# Patient Record
Sex: Female | Born: 1975 | Race: White | Hispanic: No | Marital: Married | Smoking: Former smoker
Health system: Southern US, Community
[De-identification: ages and names within clinical notes are randomized; demographics above are authoritative.]

## PROBLEM LIST (undated history)

## (undated) DIAGNOSIS — E669 Obesity, unspecified: Secondary | ICD-10-CM

## (undated) DIAGNOSIS — M545 Low back pain, unspecified: Secondary | ICD-10-CM

## (undated) DIAGNOSIS — G4700 Insomnia, unspecified: Secondary | ICD-10-CM

## (undated) DIAGNOSIS — F419 Anxiety disorder, unspecified: Secondary | ICD-10-CM

## (undated) DIAGNOSIS — C539 Malignant neoplasm of cervix uteri, unspecified: Secondary | ICD-10-CM

## (undated) DIAGNOSIS — D72829 Elevated white blood cell count, unspecified: Secondary | ICD-10-CM

## (undated) DIAGNOSIS — F17200 Nicotine dependence, unspecified, uncomplicated: Secondary | ICD-10-CM

## (undated) DIAGNOSIS — G8929 Other chronic pain: Secondary | ICD-10-CM

## (undated) HISTORY — PX: BREAST SURGERY: SHX581

## (undated) HISTORY — DX: Obesity, unspecified: E66.9

## (undated) HISTORY — DX: Malignant neoplasm of cervix uteri, unspecified: C53.9

## (undated) HISTORY — PX: ABDOMINAL HYSTERECTOMY: SHX81

## (undated) HISTORY — DX: Other chronic pain: G89.29

## (undated) HISTORY — DX: Low back pain, unspecified: M54.50

## (undated) HISTORY — DX: Anxiety disorder, unspecified: F41.9

## (undated) HISTORY — DX: Nicotine dependence, unspecified, uncomplicated: F17.200

## (undated) HISTORY — PX: CERVICAL SPINE SURGERY: SHX589

## (undated) HISTORY — DX: Low back pain: M54.5

## (undated) HISTORY — PX: TOTAL HIP ARTHROPLASTY: SHX124

## (undated) HISTORY — PX: BREAST BIOPSY: SHX20

## (undated) HISTORY — DX: Insomnia, unspecified: G47.00

## (undated) HISTORY — PX: BACK SURGERY: SHX140

## (undated) HISTORY — DX: Elevated white blood cell count, unspecified: D72.829

---

## 2004-09-06 ENCOUNTER — Encounter (INDEPENDENT_AMBULATORY_CARE_PROVIDER_SITE_OTHER): Payer: Self-pay | Admitting: Specialist

## 2004-09-06 ENCOUNTER — Observation Stay (HOSPITAL_COMMUNITY): Admission: RE | Admit: 2004-09-06 | Discharge: 2004-09-07 | Payer: Self-pay | Admitting: Gynecology

## 2006-01-28 ENCOUNTER — Ambulatory Visit: Payer: Self-pay | Admitting: Chiropractor

## 2006-09-21 ENCOUNTER — Ambulatory Visit: Payer: Self-pay | Admitting: Gynecology

## 2007-11-11 ENCOUNTER — Ambulatory Visit: Payer: Self-pay | Admitting: Gynecology

## 2008-07-19 ENCOUNTER — Ambulatory Visit: Payer: Self-pay | Admitting: Gynecology

## 2008-08-01 ENCOUNTER — Ambulatory Visit (HOSPITAL_COMMUNITY): Admission: RE | Admit: 2008-08-01 | Discharge: 2008-08-01 | Payer: Self-pay | Admitting: Gynecology

## 2008-08-09 ENCOUNTER — Ambulatory Visit (HOSPITAL_COMMUNITY): Admission: RE | Admit: 2008-08-09 | Discharge: 2008-08-09 | Payer: Self-pay | Admitting: Gynecology

## 2008-10-12 ENCOUNTER — Ambulatory Visit (HOSPITAL_COMMUNITY): Admission: RE | Admit: 2008-10-12 | Discharge: 2008-10-12 | Payer: Self-pay | Admitting: Obstetrics & Gynecology

## 2009-06-29 ENCOUNTER — Ambulatory Visit: Payer: Self-pay | Admitting: Oncology

## 2009-07-24 ENCOUNTER — Other Ambulatory Visit: Admission: RE | Admit: 2009-07-24 | Discharge: 2009-07-24 | Payer: Self-pay | Admitting: Oncology

## 2009-07-24 ENCOUNTER — Encounter: Payer: Self-pay | Admitting: Oncology

## 2009-07-24 LAB — CMP (CANCER CENTER ONLY)
ALT(SGPT): 26 U/L (ref 10–47)
AST: 23 U/L (ref 11–38)
Albumin: 3.3 g/dL (ref 3.3–5.5)
Alkaline Phosphatase: 87 U/L — ABNORMAL HIGH (ref 26–84)
BUN, Bld: 11 mg/dL (ref 7–22)
CO2: 29 mEq/L (ref 18–33)
Calcium: 9.2 mg/dL (ref 8.0–10.3)
Chloride: 101 mEq/L (ref 98–108)
Creat: 0.6 mg/dl (ref 0.6–1.2)
Glucose, Bld: 92 mg/dL (ref 73–118)
Potassium: 3.7 mEq/L (ref 3.3–4.7)
Sodium: 138 mEq/L (ref 128–145)
Total Bilirubin: 0.4 mg/dl (ref 0.20–1.60)
Total Protein: 7.9 g/dL (ref 6.4–8.1)

## 2009-07-24 LAB — MORPHOLOGY - CHCC SATELLITE
PLT EST ~~LOC~~: ADEQUATE
RBC Comments: NORMAL

## 2009-07-24 LAB — CBC WITH DIFFERENTIAL (CANCER CENTER ONLY)
EOS%: 2.8 % (ref 0.0–7.0)
HCT: 40.8 % (ref 34.8–46.6)
LYMPH#: 3.9 10*3/uL — ABNORMAL HIGH (ref 0.9–3.3)
LYMPH%: 40 % (ref 14.0–48.0)
MCHC: 34.6 g/dL (ref 32.0–36.0)
MONO%: 7.1 % (ref 0.0–13.0)
NEUT%: 48.9 % (ref 39.6–80.0)
Platelets: 351 10*3/uL (ref 145–400)
RDW: 11.3 % (ref 10.5–14.6)

## 2009-07-24 LAB — LACTATE DEHYDROGENASE: LDH: 150 U/L (ref 94–250)

## 2009-08-21 ENCOUNTER — Ambulatory Visit: Payer: Self-pay | Admitting: Oncology

## 2009-10-11 ENCOUNTER — Ambulatory Visit: Payer: Self-pay | Admitting: Oncology

## 2009-10-15 LAB — CBC WITH DIFFERENTIAL (CANCER CENTER ONLY)
Eosinophils Absolute: 0.4 10*3/uL (ref 0.0–0.5)
HGB: 14.3 g/dL (ref 11.6–15.9)
LYMPH%: 33.1 % (ref 14.0–48.0)
MCH: 31.3 pg (ref 26.0–34.0)
MCHC: 33.2 g/dL (ref 32.0–36.0)
MONO#: 0.7 10*3/uL (ref 0.1–0.9)
MONO%: 6.7 % (ref 0.0–13.0)
NEUT%: 55.7 % (ref 39.6–80.0)
Platelets: 328 10*3/uL (ref 145–400)
RDW: 10.8 % (ref 10.5–14.6)
WBC: 10.4 10*3/uL — ABNORMAL HIGH (ref 3.9–10.0)

## 2010-01-17 IMAGING — CT CT PELVIS W/ CM
2 of 5 series · 16 of 46 positions shown, 18 images · IV contrast (APPLIED)
Comparison: Right hip radiographs 08/01/2008.

CT ABDOMEN

CLINICAL DATA: Right lower quadrant abdominal pain with right
groin and hip pain for 2 years.  History back surgery.

CT ABDOMEN AND PELVIS WITH CONTRAST
TECHNIQUE: Multidetector CT imaging of the abdomen and pelvis was
performed using the standard protocol following bolus
administration of intravenous contrast.
Contrast: 100 ml Ymnipaque-JNN intravenously.  Oral contrast was
given.

[Series 2: abd/pelv with 5.0 b31f st · axial · 0.85mm/px · z∈[-514,-89]mm · 13 of 97 slices shown, 15 images]
[im 6/97  soft-tissue]
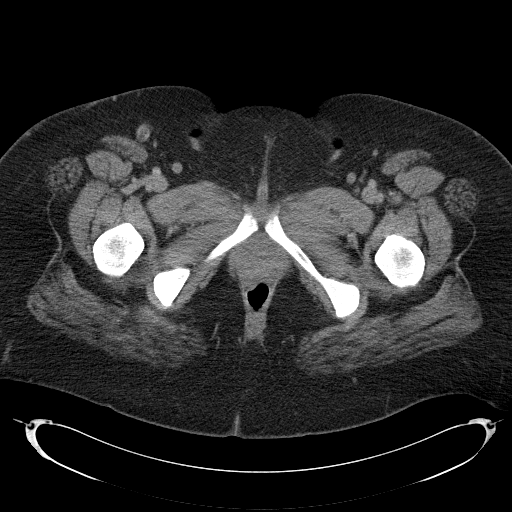
[im 6/97  bone]
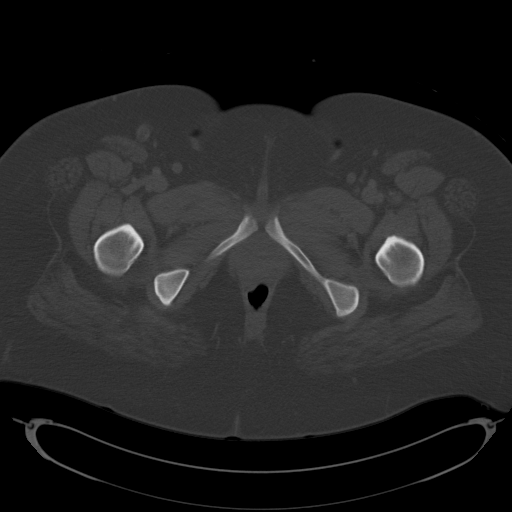
[im 16/97  soft-tissue]
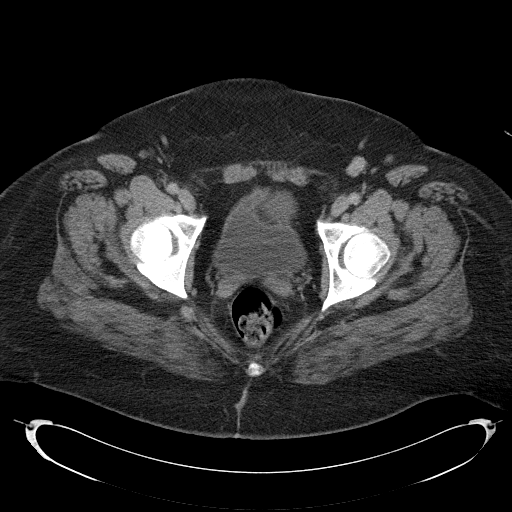
[im 21/97  soft-tissue]
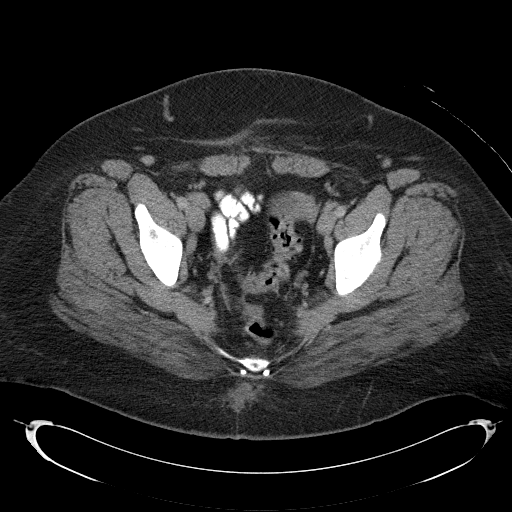
[im 26/97  soft-tissue]
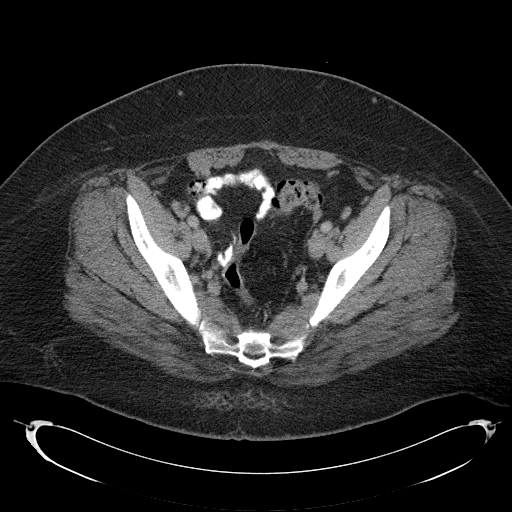
[im 36/97  soft-tissue]
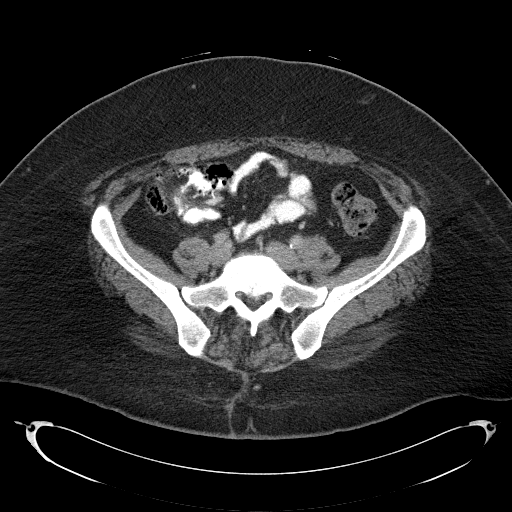
[im 41/97  soft-tissue]
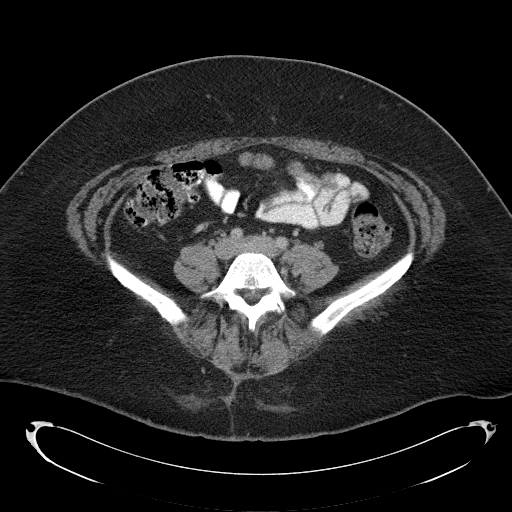
[im 51/97  soft-tissue]
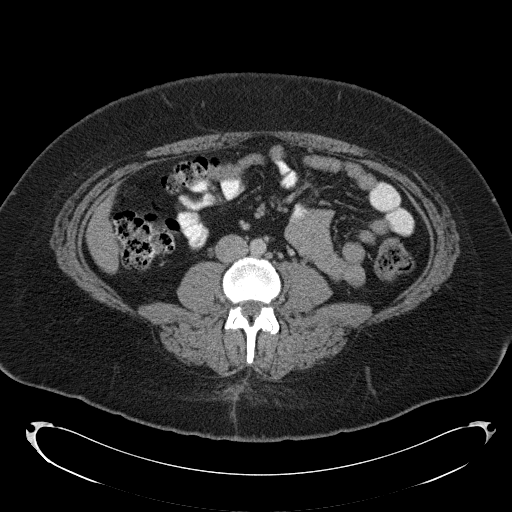
[im 56/97  soft-tissue]
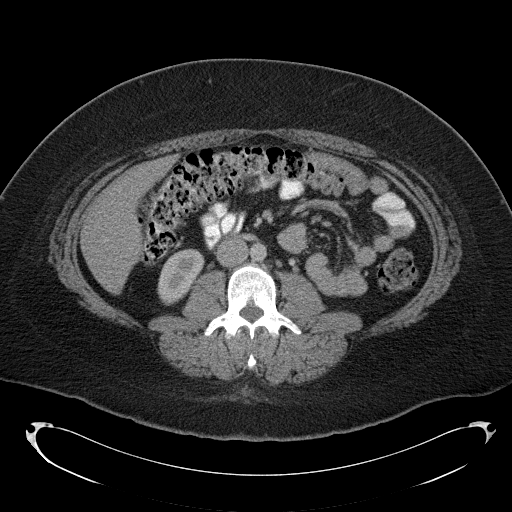
[im 61/97  soft-tissue]
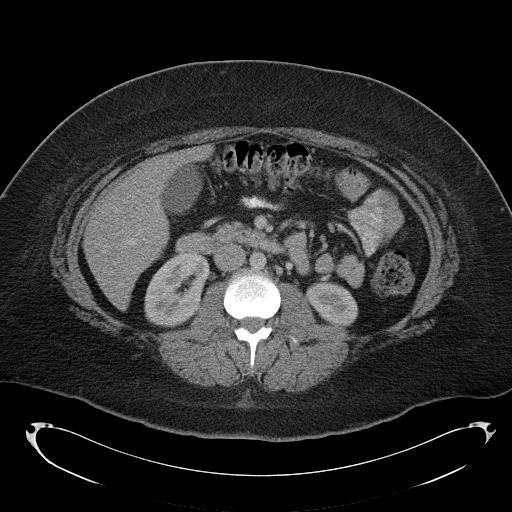
[im 61/97  bone]
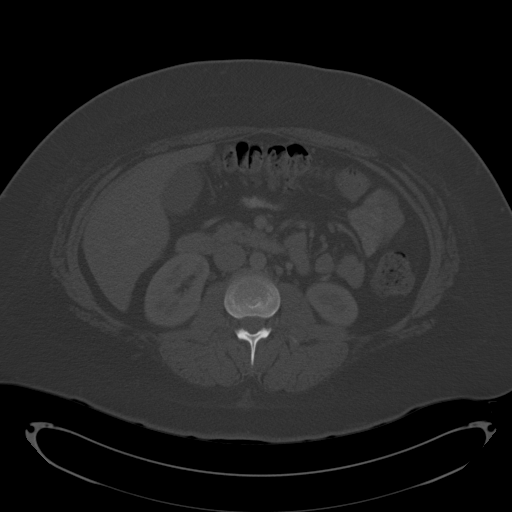
[im 71/97  soft-tissue]
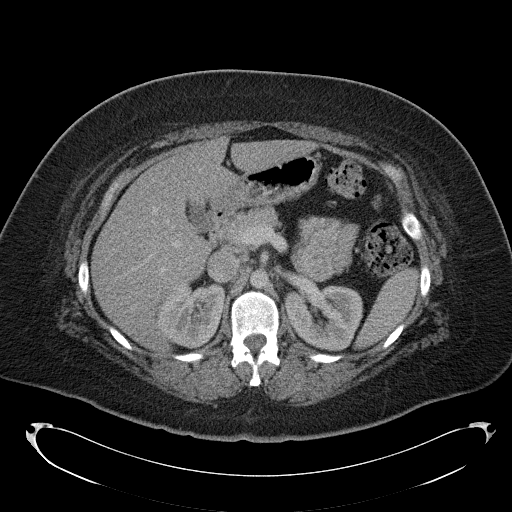
[im 76/97  soft-tissue]
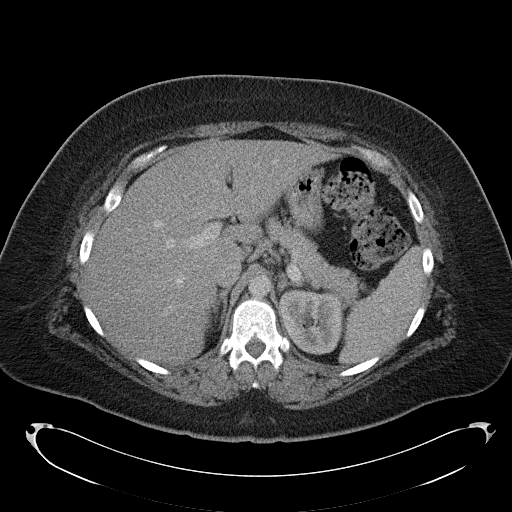
[im 81/97  soft-tissue]
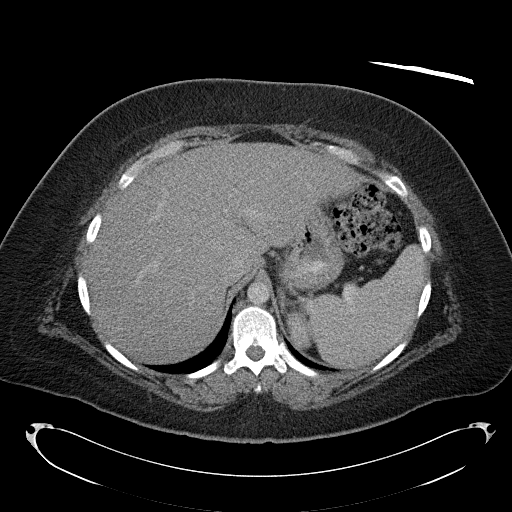
[im 91/97  soft-tissue]
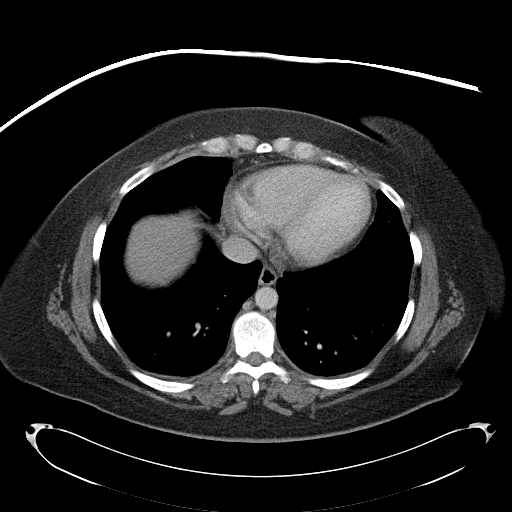

[Series 602: coronal rt hip · coronal · 0.40mm/px · 3 of 61 slices shown]
[im 21/61  soft-tissue]
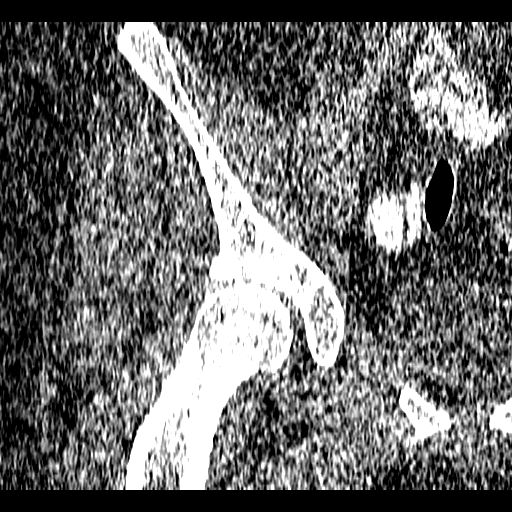
[im 27/61  soft-tissue]
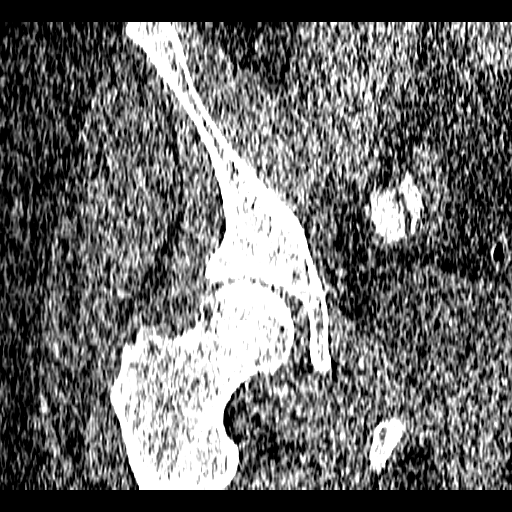
[im 34/61  soft-tissue]
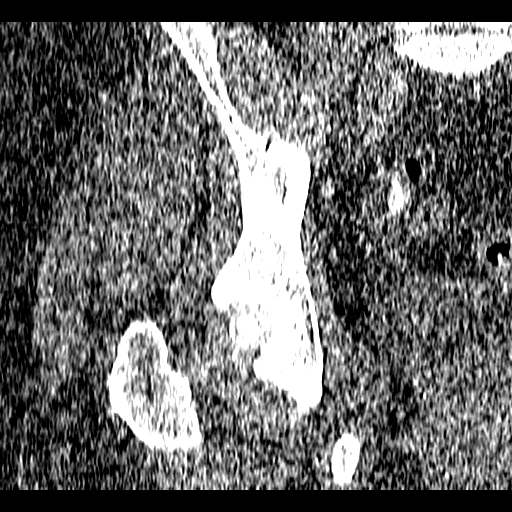

[16 of 46 positions shown; findings below may reference images not displayed]

FINDINGS: The lung bases are clear.  There is no pleural effusion.
A small hiatal hernia is noted.

The liver demonstrates mild fatty infiltration, but no focal
abnormality.  The spleen, pancreas, gallbladder, adrenal glands and
kidneys appear normal.

There is a moderate amount of stool throughout the colon.  The
bowel gas pattern is nonobstructive.  No inflammatory changes or
enlarged lymph nodes are seen.  There is degenerative disc disease
at L5-S1 with disc bulging eccentric to the left.
IMPRESSION: 1.  No acute abdominal findings.
2.  Moderate stool throughout the colon suggests constipation.
3.  Fatty infiltration of the liver and a small hiatal hernia are
noted.
4.  Degenerative disc disease at L5-S1.

CT PELVIS
FINDINGS: The uterus is surgically absent.  There is a left
adnexal low density lesion measuring 4.6 x 3.5 cm transverse on
image 80.  This appears to be associated with the left ovary and
measures near water attenuation.  The right ovary appears normal.
There are no enlarged pelvic lymph nodes.  There is a physiologic
amount of free pelvic fluid.  No focal fluid collection or
inflammatory process is evident.

The right hip joint appears unremarkable.  No hernia or
periarticular fluid collection is identified.
IMPRESSION: 1.  4.6 cm low density lesion arising from the left ovary is likely
a functional cyst in this 32-year-old.  Clinical correlation and
ultrasound followup in 6 weeks are suggested.
2.  Physiologic amount of free pelvic fluid status post
hysterectomy.
3.  No other acute pelvic findings evident.  No apparent
explanation for right lower quadrant and right hip pain.

## 2010-04-17 ENCOUNTER — Ambulatory Visit: Payer: Self-pay | Admitting: Oncology

## 2011-01-20 ENCOUNTER — Encounter: Payer: Self-pay | Admitting: Gynecology

## 2011-05-13 NOTE — Assessment & Plan Note (Signed)
NAME:  Sydney Hawkins, Sydney Hawkins NO.:  [id number]   MEDICAL RECORD NO.:  [id number]          PATIENT TYPE:  POB   LOCATION:  [facility] at [facility]         FACILITY:  [facility]   PHYSICIAN:  Ginger Carne, MD DATE OF BIRTH:  1976-06-06   DATE OF SERVICE:  07/19/2008                                  CLINIC NOTE   Ms. Groll is here today for followup because of right lower quadrant  and mid pelvic discomfort over the past 18 months.  The patient had  undergone in August 2005, a laparoscopic-assisted vaginal hysterectomy,  right salpingo-oophorectomy with appendectomy, and preservation of left  tube and ovary because of endometriosis.  Her present complaint includes  dyspareunia and discomfort principally in the right lower quadrant and  occasional radiation to the midline.  The patient has no family or  personal history of kidney stones and no GI complaints as well.   SALIENT PHYSICAL FINDINGS:  EXTERNAL GENITALIA:  Vulva and vagina  normal.  Cervix is absent and the cuff is smooth.  The patient has  central obesity and weighs 288 pounds.  There is tenderness in the right  adnexa and the left adnexa is nontender.  Midline is minimally tender as  well.   IMPRESSION AND PLAN:  It is unclear as to the etiology of this  discomfort.  The patient does not wish to have further surgery at this  time as she has had several low back operations in the past few years.  I think that certainly the possibility of the adhesive disease secondary  to bowel adhesions is a possibility, however, at this time I have  ordered a right hip x-ray to exclude hip pathology and an abdominal and  pelvic CT scan with contrast.  We also discussed concerns regarding use  of Vicodin and the importance to use this narcotics sparingly.  Sed  testing will be ordered, and the patient will be apprised of the results  and further management afterwards.           ______________________________  Ginger Carne, MD     SHB/MEDQ  D:  07/19/2008  T:  07/20/2008  Job:  [id number]

## 2011-05-13 NOTE — Assessment & Plan Note (Signed)
NAME:  Sydney Hawkins, Sydney Hawkins NO.:  [id number]   MEDICAL RECORD NO.:  [id number]          PATIENT TYPE:  POB   LOCATION:  [facility] at [facility]         FACILITY:  [facility]   PHYSICIAN:  Ginger Carne, MD DATE OF BIRTH:  [birth year]   DATE OF SERVICE:                                  CLINIC NOTE   ADDENDUM   This is an addendum note from July 19, 2008 visit.  Please refer to the  note from that date for complete and comprehensive office note.   At the end of the patient's visit, a discussion was engaged regarding  issues of depression and her weight gain.  The patient states she has  rejoined weight watchers.  Although the patient is able to cope during  the day, she has other issues of bad self-image, and she was started on  Wellbutrin XL 150 mg for 3 days and if tolerated 300 mg a day to be  taken in the morning.  Side effects were discussed with the patient and  she was encouraged to call if she experiences any of these.  A TSH level  was also ordered as well.  The patient was asked to contact the office  in about 4 weeks to see how she is doing and the patient will keep me  posted as to any changes in her mood or issues of depression.           ______________________________  Ginger Carne, MD     SHB/MEDQ  D:  07/20/2008  T:  07/20/2008  Job:  [id number]

## 2011-05-16 NOTE — H&P (Signed)
NAME:  Sydney Hawkins, Sydney Hawkins                          ACCOUNT NO.:  [id number]   MEDICAL RECORD NO.:  [id number]                   PATIENT TYPE:   LOCATION:                                       FACILITY:   PHYSICIAN:  Ginger Carne, MD               DATE OF BIRTH:  Jul 02, 1976   DATE OF ADMISSION:  DATE OF DISCHARGE:                                HISTORY & PHYSICAL   ADMISSION DIAGNOSES:  1.  Endometriosis with chronic pelvic pain.  2.  Menometrorrhagia.   PROPOSED PROCEDURES:  1.  Laparoscopic assisted vaginal hysterectomy.  2.  Right salpingo-oophorectomy with preservation left tube and ovary.  3.  Laparoscopic appendectomy.   HISTORY OF PRESENT ILLNESS:  This patient is a 35 year old gravida 2, para 2-  0-0-2, Caucasian female, admitted for the aforementioned procedures because  of menometrorrhagia, endometriosis, and chronic pelvic pain.  The patient  has had a longstanding history for approximately six years of pelvic pain  related to her menses and between her menses as well.  She was diagnosed in  2001 with stage II endometriosis by laparoscopy and placed on a six month  course initially of Depo-Lupron.  She had 1-2 years of dyspareunia and  dysmenorrhea and intramenstrual pain preceding said surgery.  Following a  six month course of Depo-Lupron the patient was placed on continual oral  contraceptives until she had discontinued these to become pregnant.  In  August of 2003 she underwent a repeat cesarean section and within six months  began experiencing similar symptoms as above.  She was placed on continual  Ortho-Cyclen and then switched to Ovcon 35 and then 50 because of  breakthrough bleeding.  The patient has continued with Ovcon 50 but had to  switch to a three week on, one week off course because of breakthrough  bleeding.  She has been utilizing Vicodin and nonsteroidal anti-inflammatory  agents for said pain.  She did not wish to continue with Depo-Lupron and  additionally was not interested in utilizing other medical forms of  management including Danocrine, a progesterone-bearing intrauterine device  with oral contraceptives and/or Depo-Provera.  The patient also had a short  term course of letrozole and __________acetate in addition to oral  contraceptives for three months with minimal benefit in her pain.  At this  point the patient feels that the discomfort has had a negative impact in her  quality of life.  She has no genitourinary or gastrointestinal or  musculoskeletal sources or findings for her pain.   OB/GYN HISTORY:  The patient had a primary cesarean section in 1997 and in  August of 2003 had a repeat cesarean section.   PAST SURGICAL HISTORY:  1.  Pilonidal cyst removal.  2.  Operative laparoscopy in 2001.   PAST MEDICAL HISTORY:  Longstanding history of migraine headaches.  She has  been managed in [place] by [facility], for which she  has been treated with biofeedback, atenolol 50 mg daily, then increased to  100 mg daily, which has recently been discontinued.   ALLERGIES:  None.   SOCIAL HISTORY:  Negative for smoking, alcohol, or illicit drug abuse.   REVIEW OF SYSTEMS:  Negative.   CURRENT MEDICATIONS:  1.  Ovcon 50 oral contraceptives.  2.  Vicodin as needed.   PHYSICAL EXAMINATION:  VITAL SIGNS:  Height 5 feet 7 inches, weight 266  pounds, blood pressure 122/80.  HEENT:  Grossly normal.  BREASTS:  Exam without mass, discharge, thickenings, or tenderness.  CHEST:  Clear to percussion and auscultation.  CARDIAC:  Without murmurs or enlargements.  Regular rate and rhythm.  EXTREMITIES:  Within normal limits.  LYMPHATICS:  Within normal limits.  SKIN:  Within normal limits.  NEUROLOGIC:  Within normal limits.  MUSCULOSKELETAL:  Within normal limits.  ABDOMEN:  Soft with well-healed Pfannenstiel incision scars.  PELVIC:  Normal Pap smear.  External genitalia, vulva, and vagina normal.  Cervix  smooth without erosions or lesions.  The uterus is tender on motion.  Both adnexa are palpable, tender.  RECTAL:  Hemoccult negative without masses.   IMPRESSION:  Recurrent endometriosis resistant to medical management.   PLAN:  Thorough discussion with the patient regarding medical and surgical  options.  Although the patient did have benefit from the Depo-Lupron after  her laparoscopy in 2001, she does not feel at this time that she wishes to  go through another course.  Her pain returned several months after  discontinuation of this drug.  She has no desire for further child bearing  and has no medical, psychological, or surgical contraindications that would  outweigh the benefits of said surgery.   The patient wished to preserve her left tube and ovary as most of her  discomfort is on the right side.  She understands there is a 30-40% chance  of recurrent pelvic pain and endometriosis surfacing that may require  removal of left tube and ovary in the future.  Nature of the laparoscopic  assisted vaginal hysterectomy, right salpingo-oophorectomy, and appendectomy  were discussed, this including injuries to ureter, bowel, and bladder,  possible conversion to an open procedure, hemorrhage, possibly requiring  blood transfusions, infection, and other unforeseen complications were  discussed and understood by said patient.  She is scheduled for  aforementioned surgery on September 06, 2004.                                               Ginger Carne, MD    SHB/MEDQ  D:  09/03/2004  T:  09/03/2004  Job:  [id number]

## 2011-05-16 NOTE — Op Note (Signed)
NAME:  Sydney Hawkins, Sydney Hawkins                        ACCOUNT NO.:  [id number]   MEDICAL RECORD NO.:  [id number]                   PATIENT TYPE:  OBV   LOCATION:  9399                                 FACILITY:  [facility]   PHYSICIAN:  Ginger Carne, MD               DATE OF BIRTH:  [birth year]   DATE OF PROCEDURE:  09/06/2004  DATE OF DISCHARGE:                                 OPERATIVE REPORT   PREOPERATIVE DIAGNOSES:  Stage 2 endometriosis and menometrorrhagia.   POSTOPERATIVE DIAGNOSES:  Stage 1 endometriosis and menometrorrhagia and  retrocecal appendix.   PROCEDURE:  Laparoscopically assisted vaginal hysterectomy with right  salpingo-oophorectomy and preservation of left tube and ovary and  laparoscopic appendectomy.   SURGEON:  Ginger Carne, M.D.   ASSISTANT:  Miguel Aschoff, M.D.   ESTIMATED BLOOD LOSS:  Less than 100 mL.   COMPLICATIONS:  None immediate.   ANESTHESIA:  General.   SPECIMENS:  Uterus, cervix, right tube and ovary, left tube and ovary saved  (appendix).   FINDINGS:  The patient demonstrated stage 1 endometriosis much of which was  burned out from previous treatment. The right tube and ovary were adherent  to its respective sidewall, left tube and ovary appeared normal. There were  punctate lesions of endometriosis along the medial aspect of the broad  ligaments, posterior aspect of the uterus, right tube and ovary.   The appendix was retrocecal with a thickened mesoappendix.  Otherwise large  and small bowel grossly normal, no evidence of femoral, inguinal or  obturator hernias.  Both ureters identified throughout the pelvic course.   DESCRIPTION OF PROCEDURE:  The patient was prepped and draped in the usual  fashion and placed in the lithotomy position. Betadine solution used for  antiseptic and the patient was catheterized prior to the procedure.  A  Pelosi uterine manipulator was placed in the endocervical canal which was  affixed with a single tooth  tenaculum to the anterior lip of the cervix.  Afterwards a vertical infraumbilical incision was made and a Veress needle  placed in the abdomen, opening and closing pressures were 10-15 mmHg.  The  release trocar placed in the same incision, laparoscope placed in the trocar  sleeve.  Two 5 mm ports were placed in the left lower quadrant and left  hypogastric regions respectively under direct visualization.  The appendix  was identified being retrocecal dissection of the peritoneum was followed by  identification of the mesoappendix, bipolar cauterization and cutting to the  base. 2-0 Vicryl suture loop ties were placed at the base and one 7 mm above  the first two. The appendix was then severed above the first two Vicryl  suture ties and removed appropriately with an endobag. Copious irrigation in  the base region was noted, no active bleeding noted.   The ureters were again identified on the right and left sides.  The right  infundibulopelvic ligament was  skeletonized, bipolar cauterized and cut.  This continued to the round ligament on the right side, the left  uteroovarian ligament was bipolar cauterized and cut. At this point, the  attention was paid to the pelvic portion of the procedure.   Repositioning and draping was followed by a double tooth tenaculum on the  anterior and posterior lips of the cervix.  10 mL of Marcaine with  epinephrine injected circumferentially around the cervix.  2 cm of anterior  and posterior vaginal epithelium were opened with cautery and the peritoneal  reflection was identified and opened.  The uterosacral cardinal ligament  complexes were clamped, cut,  and ligated with #0 Vicryl  and affixed to  their respective vaginal walls. The uterine vasculature in a standard  Richardson fashion were clamped, cut, and ligated with #0 Vicryl suture,  this extended to the broad ligaments. The uterus, cervix, right tube and  ovary were then removed. The cuff was  closed with #0 Vicryl suture, running  interlocking suture. Attention to the upper abdominal region through the  laparoscope revealed minimal bleeding which was bipolar cauterized, copious  irrigation with lactated Ringer's and irrigation was removed appropriately.  No active bleeding noted.  Gas released, trocars removed, closure of the 10  mm fascial site with #0 Vicryl suture and 4-0 Vicryl for subcuticular  closure. Instrument and sponge count were correct. The patient tolerated the  procedure well and returned to the post anesthesia recovery room in  excellent condition.                                               Ginger Carne, MD    SHB/MEDQ  D:  09/06/2004  T:  09/06/2004  Job:  [id number]

## 2011-05-16 NOTE — Discharge Summary (Signed)
NAME:  Sydney Hawkins, Sydney Hawkins                        ACCOUNT NO.:  [id number]   MEDICAL RECORD NO.:  [id number]                   PATIENT TYPE:  OBV   LOCATION:  9315                                 FACILITY:  [facility]   PHYSICIAN:  Ginger Carne, MD               DATE OF BIRTH:  [birth year]   DATE OF ADMISSION:  09/06/2004  DATE OF DISCHARGE:                                 DISCHARGE SUMMARY   FINAL DIAGNOSES:  1.  Menometrorrhagia  2.  Chronic pelvic pain.  3.  Endometriosis.   IN-HOSPITAL PROCEDURES:  1.  Laparoscopic-assisted vaginal hysterectomy.  2.  Right salpingo-oophorectomy.  3.  Laparoscopic appendectomy.  4.  Preservation of left tube and ovary.   HOSPITAL COURSE:  This is a 35 year old multiparous, Caucasian female who  underwent the aforementioned procedures on September 06, 2004.  Postoperative  course was uneventful.  She was afebrile, voided well.  Incisions were dry.  Hemoglobin was 11.0 from a preoperative 13.1 and hematocrit of 31.7 from  preoperative of 37.5.  The patient's blood pressure was maintained between  100 and 120 and 60-80.  The patient at the time of discharge was waiting to  void and if unable to we will have a Foley catheter replaced with a leg bag  to be removed in 2 days.  Routine postoperative instructions were provided  to the patient.  She is to contact the office for temperature elevation over  100.4 degrees Fahrenheit, increased vaginal drainage, abdominal pain,  constipation, or any other concerns.  The patient was prescribed Percocet  5/325, one to two every 4-6 hours for postoperative pain.  She was also  advised to take ibuprofen, four tablets, three times a day as necessary.  She will return to work p.r.n. and be seen back in the office in 4 weeks.                                               Ginger Carne, MD    SHB/MEDQ  D:  09/07/2004  T:  09/07/2004  Job:  [id number]

## 2014-02-04 ENCOUNTER — Emergency Department: Payer: Self-pay | Admitting: Internal Medicine

## 2014-11-08 ENCOUNTER — Encounter: Payer: Self-pay | Admitting: *Deleted

## 2014-11-09 ENCOUNTER — Ambulatory Visit (INDEPENDENT_AMBULATORY_CARE_PROVIDER_SITE_OTHER): Payer: BC Managed Care – PPO | Admitting: Neurology

## 2014-11-09 ENCOUNTER — Encounter: Payer: Self-pay | Admitting: Neurology

## 2014-11-09 VITALS — BP 110/80 | HR 98 | Ht 67.0 in | Wt 271.0 lb

## 2014-11-09 DIAGNOSIS — R202 Paresthesia of skin: Secondary | ICD-10-CM

## 2014-11-09 DIAGNOSIS — E538 Deficiency of other specified B group vitamins: Secondary | ICD-10-CM

## 2014-11-09 DIAGNOSIS — M5137 Other intervertebral disc degeneration, lumbosacral region: Secondary | ICD-10-CM

## 2014-11-09 DIAGNOSIS — R209 Unspecified disturbances of skin sensation: Secondary | ICD-10-CM

## 2014-11-09 DIAGNOSIS — Z720 Tobacco use: Secondary | ICD-10-CM

## 2014-11-09 DIAGNOSIS — F17200 Nicotine dependence, unspecified, uncomplicated: Secondary | ICD-10-CM

## 2014-11-09 NOTE — Patient Instructions (Addendum)
1.  Check blood work 2.  Recommend starting vitamin B12 supplement 3.  If symptoms worsen, consider EMG of right arm and leg going forward 4.  Encouraged to stop smoking and weight loss 5.  Return to clinic as needed

## 2014-11-09 NOTE — Progress Notes (Signed)
[facility] Clinic Note - Initial Visit   Date: 11/09/2014  Sydney Hawkins MRN: [id number] DOB: 03/11/1976   Dear Dr. Swayne:   Thank you for your kind referral of Sydney Hawkins for consultation of paresthesias. Although her history is well known to you, please allow us to reiterate it for the purpose of our medical record. The patient was accompanied to the clinic by self.   History of Present Illness: Sydney Hawkins is a 38 y.o. right-handed Caucasian female with chronic low back pain s/p lumbar discectomy x 2, current tobacco use, anxiety, leukocytosis, and insomnia presenting for evaluation of burning sensation of the hands and feet.    Starting in 2014, she developing intermittent burning sensation involving the hands and feet which has worsened more recently. It lasts from minutes to 30-minutes.  There is no alleviating or exacerbating factors.  She also complains of right leg dragging, but denies any associated pain.  She has intermittent spells of back spasms and is currently taking hydrocodone.  No recent falls, but endorses early fatigue of her hands and feet.    Her preliminary work-up included screening for thyroid and vitamin B12 abnormalities.  Out-side paper records, electronic medical record, and images have been reviewed where available and summarized as:  Labs 09/09/2014:  Vitamin B12 279, TSH 0.945  Past Medical History  Diagnosis Date  . Chronic low back pain   . Obesity   . Anxiety   . Insomnia   . Tobacco dependence   . Leukocytosis     History reviewed. No pertinent past surgical history.   Medications:  Current Outpatient Prescriptions on File Prior to Visit  Medication Sig Dispense Refill  . HYDROcodone-acetaminophen (NORCO/VICODIN) 5-325 MG per tablet Take 1 tablet by mouth every 6 (six) hours as needed for moderate pain.    . lisinopril-hydrochlorothiazide (PRINZIDE,ZESTORETIC) 10-12.5 MG per tablet Take 1 tablet  by mouth daily.    . Vitamin D, Cholecalciferol, 1000 UNITS TABS Take by mouth.     No current facility-administered medications on file prior to visit.    Allergies: No Known Allergies  Family History: Family History  Problem Relation Age of Onset  . Diabetes Mellitus II Paternal Grandmother   . Hypertension Maternal Grandfather   . Heart attack Maternal Grandfather   . Hyperlipidemia Maternal Grandfather   . Breast cancer Maternal Grandmother   . Stroke Father     Deceased, 67  . Diabetes Mellitus II Mother     Living    Social History: History   Social History  . Marital Status: Married    Spouse Name: N/A    Number of Children: N/A  . Years of Education: N/A   Occupational History  . Not on file.   Social History Main Topics  . Smoking status: Current Some Day Smoker -- 0.25 packs/day for 25 years    Types: Cigarettes  . Smokeless tobacco: Not on file  . Alcohol Use: 0.0 oz/week    0 Not specified per week     Comment: Special occasions  . Drug Use: No  . Sexual Activity: Not on file   Other Topics Concern  . Not on file   Social History Narrative   Lives with husband and son.   She has two two children.   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    Review of Systems:  CONSTITUTIONAL: No fevers, chills, night sweats, +  intentional weight loss.   EYES: No visual changes or eye pain ENT: No hearing changes.  No history of nose bleeds.   RESPIRATORY: No cough, wheezing and shortness of breath.   CARDIOVASCULAR: Negative for chest pain, and palpitations.   GI: Negative for abdominal discomfort, blood in stools or black stools.  No recent change in bowel habits.   GU:  No history of incontinence.   MUSCLOSKELETAL: No history of joint pain or swelling.  No myalgias.   SKIN: Negative for lesions, rash, and itching.   HEMATOLOGY/ONCOLOGY: Negative for prolonged bleeding, bruising easily, and swollen  nodes.  No history of cancer.   ENDOCRINE: Negative for cold or heat intolerance, polydipsia or goiter.   PSYCH:  No depression or anxiety symptoms.   NEURO: As Above.   Vital Signs:  BP 110/80 mmHg  Pulse 98  Ht 5' 7" (1.702 m)  Wt 271 lb (122.925 kg)  BMI 42.43 kg/m2  SpO2 98%   General Medical Exam:   General:  Well appearing, comfortable.   Eyes/ENT: see cranial nerve examination.   Neck: No masses appreciated.  Full range of motion without tenderness.  No carotid bruits. Respiratory:  Clear to auscultation, good air entry bilaterally.   Cardiac:  Regular rate and rhythm, no murmur.   Back:  No pain to palpation of spinous processes.   Extremities:  No deformities, edema, or skin discoloration. Good capillary refill.   Skin:  Skin color, texture, turgor normal. No rashes or lesions.  Neurological Exam: MENTAL STATUS including orientation to time, place, person, recent and remote memory, attention span and concentration, language, and fund of knowledge is normal.  Speech is not dysarthric.  CRANIAL NERVES: II:  No visual field defects.  Unremarkable fundi.   III-IV-VI: Pupils equal round and reactive to light.  Normal conjugate, extra-ocular eye movements in all directions of gaze.  No nystagmus.  No ptosis.   V:  Normal facial sensation.     VII:  Normal facial symmetry and movements.   VIII:  Normal hearing and vestibular function.   IX-X:  Normal palatal movement.   XI:  Normal shoulder shrug and head rotation.   XII:  Normal tongue strength and range of motion, no deviation or fasciculation.  MOTOR:  No atrophy, fasciculations or abnormal movements.  No pronator drift.  Tone is normal.    Right Upper Extremity:    Left Upper Extremity:    Deltoid  5/5   Deltoid  5/5   Biceps  5/5   Biceps  5/5   Triceps  5/5   Triceps  5/5   Wrist extensors  5/5   Wrist extensors  5/5   Wrist flexors  5/5   Wrist flexors  5/5   Finger extensors  5/5   Finger extensors  5/5     Finger flexors  5/5   Finger flexors  5/5   Dorsal interossei  5/5   Dorsal interossei  5/5   Abductor pollicis  5/5   Abductor pollicis  5/5   Tone (Ashworth scale)  0  Tone (Ashworth scale)  0   Right Lower Extremity:    Left Lower Extremity:    Hip flexors  5/5   Hip flexors  5/5   Hip extensors  5/5   Hip extensors  5/5   Knee flexors  5/5   Knee flexors  5/5   Knee extensors  5/5   Knee extensors  5/5   Dorsiflexors  5/5     Dorsiflexors  5/5   Plantarflexors  5/5   Plantarflexors  5/5   Toe extensors  5/5   Toe extensors  5/5   Toe flexors  5/5   Toe flexors  5/5   Tone (Ashworth scale)  0  Tone (Ashworth scale)  0   MSRs:  Right                                                                 Left brachioradialis 2+  brachioradialis 2+  biceps 2+  biceps 2+  triceps 2+  triceps 2+  patellar 2+  patellar 2+  ankle jerk 2+  ankle jerk 2+  Hoffman no  Hoffman no  plantar response down  plantar response down   SENSORY: Pin prick reduced over the right lateral lower leg and dorsum of the foot, vibration also reduced distal to right ankle.  Sensation to all modalties intact in the left leg and bilateral upper extremities.  COORDINATION/GAIT: Normal finger-to- nose-finger and heel-to-shin.  Intact rapid alternating movements bilaterally.  Able to rise from a chair without using arms.  Gait narrow based and stable. Toe walking intact, heel walking limited due to pain (plantar fasciitis).  Tandem gait intact.   IMPRESSION: Ms. Vasey is a 38 year-old female presenting for evaluation of bilateral hand and feet paresthesias.  Her exam is notable for sensory loss involving the right lower leg, which is likely related to known history of lumbar disc herniation at L5.  Reflexes and motor strength is preserved.  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 I have encouraged her to  start vitamin B12 supplementation.  I will also screen for other treatable causes of neuropathy.  Given the intermittent nature of her paresthesias, will hold on eletrodiagnostic testing and medications at this time.  We also had lengthy discussion regarding tobacco cessation and weight loss strategies.     PLAN/RECOMMENDATIONS:  1.  Check ESR, copper, SPEP/UPEP with IFE, 2-hr glucose tolerance 2.  Recommend starting vitamin B12 1000mcg 3.  If symptoms worsen, consider EMG of right arm and leg going forward 4.  Counseled to stop smoking and encouraged weight loss 5.  Return to clinic as needed   The duration of this appointment visit was 60 minutes of face-to-face time with the patient.  Greater than 50% of this time was spent in counseling, explanation of diagnosis, planning of further management, and coordination of care.   Thank you for allowing me to participate in patient's care.  If I can answer any additional questions, I would be pleased to do so.    Sincerely,    Donika K. Patel, DO   

## 2014-11-10 LAB — SEDIMENTATION RATE: SED RATE: 28 mm/h — AB (ref 0–22)

## 2014-11-11 LAB — COPPER, SERUM: Copper: 149 ug/dL (ref 70–175)

## 2014-11-13 LAB — UIFE/LIGHT CHAINS/TP QN, 24-HR UR
Albumin, U: DETECTED
Alpha 1, Urine: DETECTED — AB
Alpha 2, Urine: DETECTED — AB
BETA UR: DETECTED — AB
GAMMA UR: DETECTED — AB
TOTAL PROTEIN, URINE-UPE24: 7 mg/dL (ref 5–24)

## 2014-11-13 LAB — SPEP & IFE WITH QIG
ALBUMIN ELP: 49.2 % — AB (ref 55.8–66.1)
ALPHA-2-GLOBULIN: 12.5 % — AB (ref 7.1–11.8)
Alpha-1-Globulin: 5.6 % — ABNORMAL HIGH (ref 2.9–4.9)
BETA GLOBULIN: 6.9 % (ref 4.7–7.2)
Beta 2: 7.5 % — ABNORMAL HIGH (ref 3.2–6.5)
GAMMA GLOBULIN: 18.3 % (ref 11.1–18.8)
IGG (IMMUNOGLOBIN G), SERUM: 1150 mg/dL (ref 690–1700)
IGM, SERUM: 219 mg/dL (ref 52–322)
IgA: 419 mg/dL — ABNORMAL HIGH (ref 69–380)
Total Protein, Serum Electrophoresis: 7.4 g/dL (ref 6.0–8.3)

## 2014-11-15 ENCOUNTER — Telehealth: Payer: Self-pay | Admitting: Neurology

## 2014-11-15 NOTE — Telephone Encounter (Signed)
Pt is returning your call about blood work please call her at [phone]

## 2021-11-01 DIAGNOSIS — Z23 Encounter for immunization: Secondary | ICD-10-CM | POA: Diagnosis not present

## 2021-12-26 ENCOUNTER — Other Ambulatory Visit: Payer: Self-pay

## 2021-12-26 ENCOUNTER — Ambulatory Visit (INDEPENDENT_AMBULATORY_CARE_PROVIDER_SITE_OTHER): Payer: BC Managed Care – PPO | Admitting: Obstetrics and Gynecology

## 2021-12-26 ENCOUNTER — Encounter: Payer: Self-pay | Admitting: Obstetrics and Gynecology

## 2021-12-26 VITALS — BP 94/70 | HR 78 | Ht 66.0 in | Wt 251.5 lb

## 2021-12-26 DIAGNOSIS — N951 Menopausal and female climacteric states: Secondary | ICD-10-CM

## 2021-12-26 DIAGNOSIS — Z01419 Encounter for gynecological examination (general) (routine) without abnormal findings: Secondary | ICD-10-CM

## 2021-12-26 NOTE — Progress Notes (Signed)
HPI:      [name] is a 45 y.o. [phone] who LMP was No LMP recorded. Patient has had a hysterectomy.  Subjective:   She presents today for her annual examination.  She has been somewhat delinquent in her GYN care over the last few years.  She recently has begun having night sweats and daily hot flashes.  In addition she has noted some vaginal dryness with intercourse. She has previously had a hysterectomy and right oophorectomy for endometriosis. Over the last few years she has had surgery on her cervical spine and the right hip replacement.  She is doing well from the surgeries. She is also using Saxenda for weight loss.  She has lost 50 pounds in the last year and continues with a change in diet and exercise because she has not yet reached her goal. She discontinued tobacco products approximately 1 year ago. She is Scientist, physiological at [employer].    Hx: The following portions of the patient's history were reviewed and updated as appropriate:             She  has a past medical history of Anxiety, Chronic low back pain, Insomnia, Leukocytosis, Obesity, and Tobacco dependence. She does not have a problem list on file. She  has a past surgical history that includes Cesarean section; Abdominal hysterectomy; Back surgery; Breast surgery; Cervical spine surgery; and Total hip arthroplasty (Right). Her family history includes Breast cancer in her maternal grandmother; Diabetes Mellitus II in her mother and paternal grandmother; Heart attack in her maternal grandfather; Hyperlipidemia in her maternal grandfather; Hypertension in her maternal grandfather; Stroke in her father. She  reports that she quit smoking about 16 months ago. Her smoking use included cigarettes. She has a 6.25 pack-year smoking history. She does not have any smokeless tobacco history on file. She reports current alcohol use. She reports that she does not use drugs. She has a current medication list which  includes the following prescription(s): cetirizine hcl, hydrocodone-acetaminophen, lisinopril-hydrochlorothiazide, omeprazole, and vitamin d (cholecalciferol). She has No Known Allergies.       Review of Systems:  Review of Systems  Constitutional: Denied constitutional symptoms, night sweats, recent illness, fatigue, fever, insomnia and weight loss.  Eyes: Denied eye symptoms, eye pain, photophobia, vision change and visual disturbance.  Ears/Nose/Throat/Neck: Denied ear, nose, throat or neck symptoms, hearing loss, nasal discharge, sinus congestion and sore throat.  Cardiovascular: Denied cardiovascular symptoms, arrhythmia, chest pain/pressure, edema, exercise intolerance, orthopnea and palpitations.  Respiratory: Denied pulmonary symptoms, asthma, pleuritic pain, productive sputum, cough, dyspnea and wheezing.  Gastrointestinal: Denied, gastro-esophageal reflux, melena, nausea and vomiting.  Genitourinary: Denied genitourinary symptoms including symptomatic vaginal discharge, pelvic relaxation issues, and urinary complaints.  Musculoskeletal: Denied musculoskeletal symptoms, stiffness, swelling, muscle weakness and myalgia.  Dermatologic: Denied dermatology symptoms, rash and scar.  Neurologic: Denied neurology symptoms, dizziness, headache, neck pain and syncope.  Psychiatric: Denied psychiatric symptoms, anxiety and depression.  Endocrine: See HPI for additional information.   Meds:   Current Outpatient Medications on File Prior to Visit  Medication Sig Dispense Refill   Cetirizine HCl (ZYRTEC PO) Take by mouth.     HYDROcodone-acetaminophen (NORCO/VICODIN) 5-325 MG per tablet Take 1 tablet by mouth every 6 (six) hours as needed for moderate pain.     lisinopril-hydrochlorothiazide (PRINZIDE,ZESTORETIC) 10-12.5 MG per tablet Take 1 tablet by mouth daily.     OMEPRAZOLE PO Take by mouth.     Vitamin D, Cholecalciferol, 1000 UNITS TABS Take by  mouth.     No current  facility-administered medications on file prior to visit.       Objective:     Vitals:   12/26/21 0910  BP: 94/70  Pulse: 78    Filed Weights   12/26/21 0910  Weight: 251 lb 8 oz (114.1 kg)              Physical examination General NAD, Conversant  HEENT Atraumatic; Op clear with mmm.  Normo-cephalic. Pupils reactive. Anicteric sclerae  Thyroid/Neck Smooth without nodularity or enlargement. Normal ROM.  Neck Supple.  Skin No rashes, lesions or ulceration. Normal palpated skin turgor. No nodularity.  Breasts: No masses or discharge.  Symmetric.  No axillary adenopathy.  Lungs: Clear to auscultation.No rales or wheezes. Normal Respiratory effort, no retractions.  Heart: NSR.  No murmurs or rubs appreciated. No periferal edema  Abdomen: Soft.  Non-tender.  No masses.  No HSM. No hernia  Extremities: Moves all appropriately.  Normal ROM for age. No lymphadenopathy.  Neuro: Oriented to PPT.  Normal mood. Normal affect.     Pelvic:   Vulva: Normal appearance.  No lesions.   Vagina: No lesions or abnormalities noted.  Support: Normal pelvic support.  Urethra No masses tenderness or scarring.  Meatus Normal size without lesions or prolapse.  Cervix: Surgically absent   Anus: Normal exam.  No lesions.  Perineum: Normal exam.  No lesions.        Bimanual   Uterus: Surgically absent   Adnexae: No masses.  Non-tender to palpation.  Cul-de-sac: Negative for abnormality.     Assessment:    G2P2002 There are no problems to display for this patient.    1. Well woman exam with routine gynecological exam   2. Symptomatic menopausal or female climacteric states        Plan:            1.  Basic Screening Recommendations The basic screening recommendations for asymptomatic women were discussed with the patient during her visit.  The age-appropriate recommendations were discussed with her and the rational for the tests reviewed.  When I am informed by the patient that  another primary care physician has previously obtained the age-appropriate tests and they are up-to-date, only outstanding tests are ordered and referrals given as necessary.  Abnormal results of tests will be discussed with her when all of her results are completed.  Routine preventative health maintenance measures emphasized: Exercise/Diet/Weight control, Tobacco Warnings, Alcohol/Substance use risks and Stress Management Mammogram ordered 2.  FSH to determine menopausal status.   Consider E2 therapy as necessary after results return.  Orders Orders Placed This Encounter  Procedures   MM DIGITAL SCREENING BILATERAL   Follicle stimulating hormone    No orders of the defined types were placed in this encounter.         F/U  Return in about 1 year (around 12/26/2022) for Annual Physical, We will contact her with any abnormal test results.  Elonda Husky, M.D. 12/26/2021 10:05 AM

## 2021-12-26 NOTE — Progress Notes (Signed)
Patient present today for a new patient annual exam. Patient states she believes she is in early signs of perimenopause and is experiencing hot flashes at night and in AM. Patient states taking black cohash after her PCP recommended it.  Patient states no concerns at this time.

## 2021-12-27 LAB — FOLLICLE STIMULATING HORMONE: FSH: 47.8 m[IU]/mL

## 2021-12-28 NOTE — Progress Notes (Signed)
Sydney Hawkins: Your blood work does indeed show that you are now in menopause.  It is as we suspected.  If you would like to schedule a time to come and talk with me about possible estrogen replacement therapy for menopause please call the office and set this up.  We can do this as a video visit if you prefer.

## 2022-01-02 ENCOUNTER — Encounter: Payer: Self-pay | Admitting: Obstetrics and Gynecology

## 2022-01-02 ENCOUNTER — Telehealth (INDEPENDENT_AMBULATORY_CARE_PROVIDER_SITE_OTHER): Payer: BC Managed Care – PPO | Admitting: Obstetrics and Gynecology

## 2022-01-02 DIAGNOSIS — N951 Menopausal and female climacteric states: Secondary | ICD-10-CM | POA: Diagnosis not present

## 2022-01-02 NOTE — Progress Notes (Signed)
Virtual Visit via Video Note  I connected with Sydney Hawkins on 01/02/22 at  7:45 AM EST by video and verified that I was speaking with the correct person using two identifiers.    Sydney Hawkins is a 46 y.o. [phone] who LMP was No LMP recorded. Patient has had a hysterectomy. I discussed the limitations, risks, security and privacy concerns of performing an evaluation and management service by video and the availability of in person appointments. I also discussed with the patient that there may be a patient responsible charge related to this service. The patient expressed understanding and agreed to proceed.  Location of patient:  School  Patient gave explicit verbal consent for video visit:  YES  Location of provider:  [facility] office  Persons other than physician and patient involved in provider conference:  None   Subjective:   History of Present Illness:    She has begun having signs and symptoms of menopause with night sweats hot flashes and vaginal dryness.  We did an [facility] which revealed that she was indeed in menopause. Of significant patient has had a prior hysterectomy.  She would like to discuss the possibility of ERT today.  Hx: The following portions of the patient's history were reviewed and updated as appropriate:             She  has a past medical history of Anxiety, Chronic low back pain, Insomnia, Leukocytosis, Obesity, and Tobacco dependence. She does not have a problem list on file. She  has a past surgical history that includes Cesarean section; Abdominal hysterectomy; Back surgery; Breast surgery; Cervical spine surgery; and Total hip arthroplasty (Right). Her family history includes Breast cancer in her maternal grandmother; Diabetes Mellitus II in her mother and paternal grandmother; Heart attack in her maternal grandfather; Hyperlipidemia in her maternal grandfather; Hypertension in her maternal grandfather; Stroke in her father. She  reports that she quit  smoking about 17 months ago. Her smoking use included cigarettes. She has a 6.25 pack-year smoking history. She does not have any smokeless tobacco history on file. She reports current alcohol use. She reports that she does not use drugs. She has a current medication list which includes the following prescription(s): cetirizine hcl, hydrocodone-acetaminophen, lisinopril-hydrochlorothiazide, omeprazole, and vitamin d (cholecalciferol). She has No Known Allergies.       Review of Systems:  Review of Systems  Constitutional: Denied constitutional symptoms, night sweats, recent illness, fatigue, fever, insomnia and weight loss.  Eyes: Denied eye symptoms, eye pain, photophobia, vision change and visual disturbance.  Ears/Nose/Throat/Neck: Denied ear, nose, throat or neck symptoms, hearing loss, nasal discharge, sinus congestion and sore throat.  Cardiovascular: Denied cardiovascular symptoms, arrhythmia, chest pain/pressure, edema, exercise intolerance, orthopnea and palpitations.  Respiratory: Denied pulmonary symptoms, asthma, pleuritic pain, productive sputum, cough, dyspnea and wheezing.  Gastrointestinal: Denied, gastro-esophageal reflux, melena, nausea and vomiting.  Genitourinary: Denied genitourinary symptoms including symptomatic vaginal discharge, pelvic relaxation issues, and urinary complaints.  Musculoskeletal: Denied musculoskeletal symptoms, stiffness, swelling, muscle weakness and myalgia.  Dermatologic: Denied dermatology symptoms, rash and scar.  Neurologic: Denied neurology symptoms, dizziness, headache, neck pain and syncope.  Psychiatric: Denied psychiatric symptoms, anxiety and depression.  Endocrine: See HPI for additional information.   Meds:   Current Outpatient Medications on File Prior to Visit  Medication Sig Dispense Refill   Cetirizine HCl (ZYRTEC PO) Take by mouth.     HYDROcodone-acetaminophen (NORCO/VICODIN) 5-325 MG per tablet Take 1 tablet by mouth every 6  (six) hours  as needed for moderate pain.     lisinopril-hydrochlorothiazide (PRINZIDE,ZESTORETIC) 10-12.5 MG per tablet Take 1 tablet by mouth daily.     OMEPRAZOLE PO Take by mouth.     Vitamin D, Cholecalciferol, 1000 UNITS TABS Take by mouth.     No current facility-administered medications on file prior to visit.    Assessment:    G2P2002 There are no problems to display for this patient.    1. Symptomatic menopausal or female climacteric states       Plan:            1.  HRT I have discussed HRT with the patient in detail.  The risk/benefits of it were reviewed.  She understands that during menopause Estrogen decreases dramatically and that this results in an increased risk of cardiovascular disease as well as osteoporosis.  We have also discussed the fact that hot flashes often result from a decrease in Estrogen, and that by replacing Estrogen, they can often be alleviated.  We have discussed skin, vaginal and urinary tract changes that may also take place from this drop in Estrogen.  Emotional changes have also been linked to Estrogen and we have briefly discussed this.  The benefits of HRT including decrease in hot flashes, vaginal dryness, and osteoporosis were discussed.  The emotional benefit and a possible change in her cardiovascular risk profile was also reviewed.  The risks associated with Hormone Replacement Therapy were also reviewed.  The fact that there has been no consistent definitive studies showing an increase in breast cancer in women who use HRT was discussed with the patient.  Special emphasis on the WHI study, as well as several studies since that pertaining to the risks and benefits of estrogen replacement therapy were compared.  The possible limitations of these studies were discussed including the age stratification of the WHI study.  The possible increased role of Progesterone in these studies was discussed in detail.  Different types of hormone formulation and  methods of taking hormone replacement therapy were discussed. Literature on HRT was made available, and I believe that after answering all of the patients questions she has an adequate and informed understanding of the risks and benefits of HRT.  Orders No orders of the defined types were placed in this encounter.   No orders of the defined types were placed in this encounter.     F/U  No follow-ups on file.  I spent 24 minutes involved in the care of this patient preparing to see the patient by obtaining and reviewing her medical history (including labs, imaging tests and prior procedures), documenting clinical information in the electronic health record (EHR), counseling and coordinating care plans, writing and sending prescriptions, ordering tests or procedures and in direct communicating with the patient and medical staff discussing pertinent items from her history and physical exam.  Finis Bud, M.D. 01/02/2022 8:17 AM

## 2022-01-02 NOTE — Patient Instructions (Signed)
[email or web address]  Read last paragraph in section "outcomes from ert versus placebo   And conclusions

## 2022-01-03 ENCOUNTER — Encounter: Payer: Self-pay | Admitting: Obstetrics and Gynecology

## 2022-01-07 ENCOUNTER — Encounter: Payer: Self-pay | Admitting: Obstetrics and Gynecology

## 2022-01-13 ENCOUNTER — Encounter: Payer: Self-pay | Admitting: Obstetrics and Gynecology

## 2022-01-13 ENCOUNTER — Telehealth: Payer: Self-pay | Admitting: Obstetrics and Gynecology

## 2022-01-13 NOTE — Telephone Encounter (Signed)
Pt called and stated that she messaged the doctor about her hormone replacement therapy. She was wanting to move forward with getting the medication. Stated she spoke with the pharmacy thinking maybe he just sent in the RX but nothing available for her.

## 2022-01-14 ENCOUNTER — Other Ambulatory Visit: Payer: Self-pay

## 2022-01-14 ENCOUNTER — Ambulatory Visit
Admission: RE | Admit: 2022-01-14 | Discharge: 2022-01-14 | Disposition: A | Discharge status: Home / Self Care | Payer: BC Managed Care – PPO | Source: Ambulatory Visit | Attending: Obstetrics and Gynecology | Admitting: Obstetrics and Gynecology

## 2022-01-14 DIAGNOSIS — Z1231 Encounter for screening mammogram for malignant neoplasm of breast: Secondary | ICD-10-CM | POA: Diagnosis not present

## 2022-01-14 DIAGNOSIS — Z01419 Encounter for gynecological examination (general) (routine) without abnormal findings: Secondary | ICD-10-CM | POA: Insufficient documentation

## 2022-01-16 ENCOUNTER — Inpatient Hospital Stay
Admission: RE | Admit: 2022-01-16 | Discharge: 2022-01-16 | Disposition: A | Discharge status: Home / Self Care | Payer: Self-pay | Source: Ambulatory Visit | Attending: *Deleted | Admitting: *Deleted

## 2022-01-16 ENCOUNTER — Other Ambulatory Visit: Payer: Self-pay | Admitting: Obstetrics and Gynecology

## 2022-01-16 ENCOUNTER — Other Ambulatory Visit: Payer: Self-pay | Admitting: *Deleted

## 2022-01-16 DIAGNOSIS — Z1231 Encounter for screening mammogram for malignant neoplasm of breast: Secondary | ICD-10-CM

## 2022-01-16 DIAGNOSIS — N951 Menopausal and female climacteric states: Secondary | ICD-10-CM

## 2022-01-16 MED ORDER — ESTRADIOL 1 MG PO TABS: 1.0000 mg | ORAL_TABLET | Freq: Every day | ORAL | 0 refills | Status: DC

## 2022-03-25 ENCOUNTER — Ambulatory Visit: Payer: BC Managed Care – PPO | Admitting: Obstetrics and Gynecology

## 2022-03-25 ENCOUNTER — Encounter: Payer: Self-pay | Admitting: Obstetrics and Gynecology

## 2022-03-25 ENCOUNTER — Other Ambulatory Visit: Payer: Self-pay

## 2022-03-25 VITALS — BP 110/76 | HR 69 | Ht 66.0 in | Wt 250.6 lb

## 2022-03-25 DIAGNOSIS — N951 Menopausal and female climacteric states: Secondary | ICD-10-CM | POA: Diagnosis not present

## 2022-03-25 DIAGNOSIS — Z79890 Hormone replacement therapy (postmenopausal): Secondary | ICD-10-CM

## 2022-03-25 MED ORDER — ESTRADIOL 1 MG PO TABS: 1.0000 mg | ORAL_TABLET | Freq: Every day | ORAL | 2 refills | Status: DC

## 2022-03-25 NOTE — Progress Notes (Signed)
HPI: ?     Sydney Hawkins is a 46 y.o. [phone] who LMP was No LMP recorded. Patient has had a hysterectomy. ? ?Subjective:  ? ?She presents today for follow-up of ERT.  She was diagnosed with menopause and was having symptoms.  She reports that her hot flashes have completely resolved.  Reports vaginal dryness is resolving.  She did have an issue with hypersensitivity of her skin and some joint pain.  She reports both of these things are also resolving.  She is generally happy with the ERT and would like to continue it. ? ?  Hx: ?The following portions of the patient's history were reviewed and updated as appropriate: ?            She  has a past medical history of Anxiety, Chronic low back pain, Insomnia, Leukocytosis, Obesity, and Tobacco dependence. ?She does not have a problem list on file. ?She  has a past surgical history that includes Cesarean section; Abdominal hysterectomy; Back surgery; Breast surgery; Cervical spine surgery; Total hip arthroplasty (Right); and Breast biopsy. ?Her family history includes Diabetes Mellitus II in her mother and paternal grandmother; Heart attack in her maternal grandfather; Hyperlipidemia in her maternal grandfather; Hypertension in her maternal grandfather; Stroke in her father. ?She  reports that she quit smoking about 19 months ago. Her smoking use included cigarettes. She has a 6.25 pack-year smoking history. She does not have any smokeless tobacco history on file. She reports current alcohol use. She reports that she does not use drugs. ?She has a current medication list which includes the following prescription(s): cetirizine hcl, hydrocodone-acetaminophen, lisinopril, omeprazole, vitamin d (cholecalciferol), and estradiol. ?She is allergic to advil [ibuprofen]. ?      ?Review of Systems:  ?Review of Systems ? ?Constitutional: Denied constitutional symptoms, night sweats, recent illness, fatigue, fever, insomnia and weight loss.  ?Eyes: Denied eye symptoms, eye  pain, photophobia, vision change and visual disturbance.  ?Ears/Nose/Throat/Neck: Denied ear, nose, throat or neck symptoms, hearing loss, nasal discharge, sinus congestion and sore throat.  ?Cardiovascular: Denied cardiovascular symptoms, arrhythmia, chest pain/pressure, edema, exercise intolerance, orthopnea and palpitations.  ?Respiratory: Denied pulmonary symptoms, asthma, pleuritic pain, productive sputum, cough, dyspnea and wheezing.  ?Gastrointestinal: Denied, gastro-esophageal reflux, melena, nausea and vomiting.  ?Genitourinary: Denied genitourinary symptoms including symptomatic vaginal discharge, pelvic relaxation issues, and urinary complaints.  ?Musculoskeletal: Denied musculoskeletal symptoms, stiffness, swelling, muscle weakness and myalgia.  ?Dermatologic: Denied dermatology symptoms, rash and scar.  ?Neurologic: Denied neurology symptoms, dizziness, headache, neck pain and syncope.  ?Psychiatric: Denied psychiatric symptoms, anxiety and depression.  ?Endocrine: See HPI for additional information.  ? ?Meds: ?  ?Current Outpatient Medications on File Prior to Visit  ?Medication Sig Dispense Refill  ? Cetirizine HCl (ZYRTEC PO) Take by mouth.    ? HYDROcodone-acetaminophen (NORCO/VICODIN) 5-325 MG per tablet Take 1 tablet by mouth every 6 (six) hours as needed for moderate pain.    ? lisinopril (ZESTRIL) 10 MG tablet Take 10 mg by mouth daily.    ? OMEPRAZOLE PO Take by mouth.    ? Vitamin D, Cholecalciferol, 1000 UNITS TABS Take by mouth.    ? ?No current facility-administered medications on file prior to visit.  ? ? ? ? ?Objective:  ?  ? ?Vitals:  ? 03/25/22 1057  ?BP: 110/76  ?Pulse: 69  ? ?Filed Weights  ? 03/25/22 1057  ?Weight: 250 lb 9.6 oz (113.7 kg)  ? ?  ?          ?        ? ?  Assessment:  ?  ?G2P2002 ?There are no problems to display for this patient. ? ?  ?1. Symptomatic menopausal or female climacteric states   ?2. Postmenopausal hormone therapy   ? ? Patient doing well on ERT. ? ? ?Plan:   ?  ?       ? 1.  Continue ERT.  All questions answered. ?Orders ?No orders of the defined types were placed in this encounter. ? ?  ?Meds ordered this encounter  ?Medications  ? estradiol (ESTRACE) 1 MG tablet  ?  Sig: Take 1 tablet (1 mg total) by mouth daily.  ?  Dispense:  90 tablet  ?  Refill:  2  ?  ?  F/U ? Return for Annual Physical. ?I spent 21 minutes involved in the care of this patient preparing to see the patient by obtaining and reviewing her medical history (including labs, imaging tests and prior procedures), documenting clinical information in the electronic health record (EHR), counseling and coordinating care plans, writing and sending prescriptions, ordering tests or procedures and in direct communicating with the patient and medical staff discussing pertinent items from her history and physical exam. ? ?Elonda Husky, M.D. ?03/25/2022 ?11:25 AM ? ? ? ? ?

## 2022-03-25 NOTE — Progress Notes (Signed)
Patient presents today for follow-up for HRT. She states her hot flashes have decreased along with her hypersensitivity and vaginal dryness. She reports sleeping is still not better. Patient states no other questions or concerns. ?

## 2022-12-10 ENCOUNTER — Other Ambulatory Visit: Payer: Self-pay | Admitting: Obstetrics and Gynecology

## 2022-12-10 DIAGNOSIS — N951 Menopausal and female climacteric states: Secondary | ICD-10-CM

## 2022-12-23 ENCOUNTER — Telehealth: Payer: Self-pay | Admitting: Obstetrics and Gynecology

## 2022-12-23 NOTE — Telephone Encounter (Signed)
I contacted patient via phone. I left message for patient to call back to rescheduled appointment for 01/12/23 with Dr. Michel Harrow due to him being out of office.

## 2022-12-26 NOTE — Telephone Encounter (Signed)
Patient rescheduled to 2/13 with Dr. Logan Bores

## 2023-01-12 ENCOUNTER — Encounter: Payer: Self-pay | Admitting: Obstetrics and Gynecology

## 2023-02-10 ENCOUNTER — Encounter: Payer: Self-pay | Admitting: Obstetrics and Gynecology

## 2023-02-10 ENCOUNTER — Ambulatory Visit (INDEPENDENT_AMBULATORY_CARE_PROVIDER_SITE_OTHER): Payer: BC Managed Care – PPO | Admitting: Obstetrics and Gynecology

## 2023-02-10 VITALS — BP 101/70 | HR 68 | Ht 66.0 in | Wt 238.9 lb

## 2023-02-10 DIAGNOSIS — Z1231 Encounter for screening mammogram for malignant neoplasm of breast: Secondary | ICD-10-CM

## 2023-02-10 DIAGNOSIS — Z01419 Encounter for gynecological examination (general) (routine) without abnormal findings: Secondary | ICD-10-CM | POA: Diagnosis not present

## 2023-02-10 DIAGNOSIS — N951 Menopausal and female climacteric states: Secondary | ICD-10-CM

## 2023-02-10 DIAGNOSIS — Z79890 Hormone replacement therapy (postmenopausal): Secondary | ICD-10-CM

## 2023-02-10 MED ORDER — ESTRADIOL 1 MG PO TABS: 1.0000 mg | ORAL_TABLET | Freq: Every day | ORAL | 3 refills | Status: DC

## 2023-02-10 NOTE — Progress Notes (Signed)
Patients presents for annual exam today. She states continuing to do well with HRT. History of hysterectomy. Patient is due for mammogram, ordered. Annual labs are deferred to PCP. She  states no other questions or concerns at this time.

## 2023-02-10 NOTE — Progress Notes (Addendum)
 HPI:      [name] is a 47 y.o. [phone] who LMP was No LMP recorded. Patient has had a hysterectomy.  Subjective:   She presents today for her annual examination.  She reports she is doing very well on estradiol .  She would like to continue.  She has no other problems at this time. Continues to be part of [employer] [place] acres behind her house for future family housing development. She is considering a "breast lift" in the near future    Hx: The following portions of the patient's history were reviewed and updated as appropriate:             She  has a past medical history of Anxiety, Chronic low back pain, Insomnia, Leukocytosis, Obesity, and Tobacco dependence. She does not have a problem list on file. She  has a past surgical history that includes Cesarean section; Abdominal hysterectomy; Back surgery; Breast surgery; Cervical spine surgery; Total hip arthroplasty (Right); and Breast biopsy. Her family history includes Diabetes Mellitus II in her mother and paternal grandmother; Heart attack in her maternal grandfather; Hyperlipidemia in her maternal grandfather; Hypertension in her maternal grandfather; Stroke in her father. She  reports that she quit smoking about 2 years ago. Her smoking use included cigarettes. She has a 6.25 pack-year smoking history. She does not have any smokeless tobacco history on file. She reports current alcohol use. She reports that she does not use drugs. She has a current medication list which includes the following prescription(s): cetirizine hcl, estradiol , hydrocodone-acetaminophen, lisinopril, omeprazole, wegovy, and vitamin d (cholecalciferol). She is allergic to advil [ibuprofen].       Review of Systems:  Review of Systems  Constitutional: Denied constitutional symptoms, night sweats, recent illness, fatigue, fever, insomnia and weight loss.  Eyes: Denied eye symptoms, eye pain, photophobia, vision change and  visual disturbance.  Ears/Nose/Throat/Neck: Denied ear, nose, throat or neck symptoms, hearing loss, nasal discharge, sinus congestion and sore throat.  Cardiovascular: Denied cardiovascular symptoms, arrhythmia, chest pain/pressure, edema, exercise intolerance, orthopnea and palpitations.  Respiratory: Denied pulmonary symptoms, asthma, pleuritic pain, productive sputum, cough, dyspnea and wheezing.  Gastrointestinal: Denied, gastro-esophageal reflux, melena, nausea and vomiting.  Genitourinary: Denied genitourinary symptoms including symptomatic vaginal discharge, pelvic relaxation issues, and urinary complaints.  Musculoskeletal: Denied musculoskeletal symptoms, stiffness, swelling, muscle weakness and myalgia.  Dermatologic: Denied dermatology symptoms, rash and scar.  Neurologic: Denied neurology symptoms, dizziness, headache, neck pain and syncope.  Psychiatric: Denied psychiatric symptoms, anxiety and depression.  Endocrine: Denied endocrine symptoms including hot flashes and night sweats.   Meds:   Current Outpatient Medications on File Prior to Visit  Medication Sig Dispense Refill   Cetirizine HCl (ZYRTEC PO) Take by mouth.     HYDROcodone-acetaminophen (NORCO/VICODIN) 5-325 MG per tablet Take 1 tablet by mouth every 6 (six) hours as needed for moderate pain.     lisinopril (ZESTRIL) 10 MG tablet Take 10 mg by mouth daily.     OMEPRAZOLE PO Take by mouth.     Semaglutide-Weight Management (WEGOVY) 2.4 MG/0.75ML SOAJ Inject 2.4 mg into the skin.     Vitamin D, Cholecalciferol, 1000 UNITS TABS Take by mouth.     No current facility-administered medications on file prior to visit.     Objective:     Vitals:   02/10/23 1451  BP: 101/70  Pulse: 68    Filed Weights   02/10/23 1451  Weight: 238 lb 14.4 oz (108.4 kg)  Patient declined examination today  Assessment:    G2P2002 There are no problems to display for this patient.    1. Well woman exam with  routine gynecological exam   2. Postmenopausal hormone therapy   3. Screening mammogram for breast cancer   4. Symptomatic menopausal or female climacteric states        Plan:            1.  Basic Screening Recommendations The basic screening recommendations for asymptomatic women were discussed with the patient during her visit.  The age-appropriate recommendations were discussed with her and the rational for the tests reviewed.  When I am informed by the patient that another primary care physician has previously obtained the age-appropriate tests and they are up-to-date, only outstanding tests are ordered and referrals given as necessary.  Abnormal results of tests will be discussed with her when all of her results are completed.  Routine preventative health maintenance measures emphasized: Exercise/Diet/Weight control, Tobacco Warnings, Alcohol/Substance use risks and Stress Management Mammogram ordered 2.  Continue estradiol  Orders Orders Placed This Encounter  Procedures   MM DIGITAL SCREENING BILATERAL     Meds ordered this encounter  Medications   estradiol  (ESTRACE ) 1 MG tablet    Sig: Take 1 tablet (1 mg total) by mouth daily.    Dispense:  90 tablet    Refill:  3          F/U  Return in about 1 year (around 02/11/2024) for Annual Physical.  Delice Felt, M.D. 02/10/2023 3:22 PM

## 2023-03-24 ENCOUNTER — Other Ambulatory Visit: Payer: Self-pay | Admitting: Family Medicine

## 2023-03-24 DIAGNOSIS — Z1231 Encounter for screening mammogram for malignant neoplasm of breast: Secondary | ICD-10-CM

## 2023-03-25 ENCOUNTER — Other Ambulatory Visit (HOSPITAL_COMMUNITY): Payer: Self-pay | Admitting: Family Medicine

## 2023-03-25 ENCOUNTER — Ambulatory Visit (HOSPITAL_COMMUNITY)
Admission: RE | Admit: 2023-03-25 | Discharge: 2023-03-25 | Disposition: A | Discharge status: Home / Self Care | Payer: BC Managed Care – PPO | Source: Ambulatory Visit | Attending: Internal Medicine | Admitting: Internal Medicine

## 2023-03-25 DIAGNOSIS — R600 Localized edema: Secondary | ICD-10-CM

## 2023-04-06 ENCOUNTER — Ambulatory Visit
Admission: RE | Admit: 2023-04-06 | Discharge: 2023-04-06 | Disposition: A | Discharge status: Home / Self Care | Payer: BC Managed Care – PPO | Source: Ambulatory Visit | Attending: Family Medicine | Admitting: Family Medicine

## 2023-04-06 DIAGNOSIS — Z1231 Encounter for screening mammogram for malignant neoplasm of breast: Secondary | ICD-10-CM | POA: Insufficient documentation

## 2023-09-25 ENCOUNTER — Other Ambulatory Visit: Payer: Self-pay | Admitting: Nurse Practitioner

## 2023-09-25 DIAGNOSIS — R109 Unspecified abdominal pain: Secondary | ICD-10-CM

## 2023-10-06 ENCOUNTER — Ambulatory Visit
Admission: RE | Admit: 2023-10-06 | Discharge: 2023-10-06 | Disposition: A | Discharge status: Home / Self Care | Payer: BC Managed Care – PPO | Source: Ambulatory Visit | Attending: Nurse Practitioner | Admitting: Nurse Practitioner

## 2023-10-06 DIAGNOSIS — R109 Unspecified abdominal pain: Secondary | ICD-10-CM

## 2023-10-29 ENCOUNTER — Other Ambulatory Visit: Payer: Self-pay | Admitting: Nurse Practitioner

## 2023-10-29 DIAGNOSIS — R109 Unspecified abdominal pain: Secondary | ICD-10-CM

## 2023-11-02 ENCOUNTER — Ambulatory Visit
Admission: RE | Admit: 2023-11-02 | Discharge: 2023-11-02 | Disposition: A | Discharge status: Home / Self Care | Payer: BC Managed Care – PPO | Source: Ambulatory Visit | Attending: Nurse Practitioner | Admitting: Nurse Practitioner

## 2023-11-02 DIAGNOSIS — R109 Unspecified abdominal pain: Secondary | ICD-10-CM

## 2023-11-02 MED ORDER — IOPAMIDOL (ISOVUE-300) INJECTION 61%
100.0000 mL | Freq: Once | INTRAVENOUS | Status: AC | PRN
  Administered 2023-11-02: 100 mL via INTRAVENOUS

## 2023-12-25 ENCOUNTER — Inpatient Hospital Stay: Payer: BC Managed Care – PPO

## 2023-12-25 ENCOUNTER — Inpatient Hospital Stay: Payer: BC Managed Care – PPO | Attending: Oncology | Admitting: Oncology

## 2023-12-25 ENCOUNTER — Encounter: Payer: Self-pay | Admitting: Oncology

## 2023-12-25 VITALS — BP 138/85 | HR 63 | Temp 97.8°F | Resp 18 | Ht 66.0 in | Wt 251.0 lb

## 2023-12-25 DIAGNOSIS — D509 Iron deficiency anemia, unspecified: Secondary | ICD-10-CM | POA: Insufficient documentation

## 2023-12-25 DIAGNOSIS — Z8541 Personal history of malignant neoplasm of cervix uteri: Secondary | ICD-10-CM | POA: Diagnosis not present

## 2023-12-25 DIAGNOSIS — Z87891 Personal history of nicotine dependence: Secondary | ICD-10-CM | POA: Insufficient documentation

## 2023-12-25 NOTE — Progress Notes (Signed)
[facility]  [phone]) [phone] Fax:(336) [phone]  ID: Sydney Hawkins OB: [birth year]  MR#: [id number]  GMW#:[id number]  Patient Care Team: Tally Joe, MD as PCP - General (Family Medicine) Jeralyn Ruths, MD as Consulting Physician (Oncology)  CHIEF COMPLAINT: Iron deficiency.  INTERVAL HISTORY: Patient is a 47 year old female with iron deficiency.  Despite taking oral iron supplementation, her iron stores have not increased appreciably.  Her most recent hemoglobin was within normal limits.  She has increased fatigue, but otherwise feels well.  She has no neurologic complaints.  She denies any recent fevers or illnesses.  She has a good appetite and denies weight loss.  She has no chest pain, shortness of breath, cough, or hemoptysis.  She denies any nausea, vomiting, constipation, or diarrhea.  She has no melena or hematochezia.  She has no urinary complaints.  Patient offers no further specific complaints today.  REVIEW OF SYSTEMS:   Review of Systems  Constitutional:  Positive for malaise/fatigue. Negative for fever.  Respiratory: Negative.  Negative for cough, hemoptysis and shortness of breath.   Cardiovascular: Negative.  Negative for chest pain and leg swelling.  Gastrointestinal: Negative.  Negative for abdominal pain, blood in stool and melena.  Genitourinary: Negative.  Negative for dysuria and hematuria.  Musculoskeletal: Negative.  Negative for back pain.  Skin: Negative.  Negative for rash.  Neurological: Negative.  Negative for dizziness, focal weakness, weakness and headaches.  Psychiatric/Behavioral: Negative.  The patient is not nervous/anxious.     As per HPI. Otherwise, a complete review of systems is negative.  PAST MEDICAL HISTORY: Past Medical History:  Diagnosis Date   Anxiety    Cervical cancer (HCC)    Chronic low back pain    Insomnia    Leukocytosis    Obesity    Tobacco dependence     PAST SURGICAL HISTORY: Past  Surgical History:  Procedure Laterality Date   ABDOMINAL HYSTERECTOMY     BACK SURGERY     BREAST BIOPSY     BREAST SURGERY     CERVICAL SPINE SURGERY     CESAREAN SECTION     TOTAL HIP ARTHROPLASTY Right     FAMILY HISTORY: Family History  Problem Relation Age of Onset   Diabetes Mellitus II Mother        Living   Stroke Father        Deceased, 23   Hypertension Maternal Grandfather    Heart attack Maternal Grandfather    Hyperlipidemia Maternal Grandfather    Diabetes Mellitus II Paternal Grandmother     ADVANCED DIRECTIVES (Y/N):  N  HEALTH MAINTENANCE: Social History   Tobacco Use   Smoking status: Former    Current packs/day: 0.00    Average packs/day: 0.3 packs/day for 25.0 years (6.3 ttl pk-yrs)    Types: Cigarettes    Start date: 07/1995    Quit date: 07/2020    Years since quitting: 3.4  Vaping Use   Vaping status: Never Used  Substance Use Topics   Alcohol use: Yes    Alcohol/week: 0.0 standard drinks of alcohol    Comment: Special occasions   Drug use: No     Colonoscopy:  PAP:  Bone density:  Lipid panel:  Allergies  Allergen Reactions   Advil [Ibuprofen] Hives    Current Outpatient Medications  Medication Sig Dispense Refill   ALPRAZolam (XANAX) 0.5 MG tablet Take 0.5 mg by mouth at bedtime as needed.     estradiol (ESTRACE) 1 MG  tablet Take 1 tablet (1 mg total) by mouth daily. 90 tablet 3   ferrous sulfate ER (SLOW FE) 142 (45 Fe) MG TBCR tablet Take by mouth.     HYDROcodone-acetaminophen (NORCO/VICODIN) 5-325 MG per tablet Take 1 tablet by mouth every 6 (six) hours as needed for moderate pain.     loratadine (CLARITIN) 10 MG tablet Take 10 mg by mouth daily.     MOUNJARO 2.5 MG/0.5ML Pen Inject 2.5 mg into the skin once a week.     OMEPRAZOLE PO Take by mouth.     Vitamin D, Cholecalciferol, 1000 UNITS TABS Take by mouth.     No current facility-administered medications for this visit.    OBJECTIVE: Vitals:   12/25/23 1124  BP:  138/85  Pulse: 63  Resp: 18  Temp: 97.8 F (36.6 C)  SpO2: 100%     Body mass index is 40.51 kg/m.    ECOG FS:0 - Asymptomatic  General: Well-developed, well-nourished, no acute distress. Eyes: Pink conjunctiva, anicteric sclera. HEENT: Normocephalic, moist mucous membranes. Lungs: No audible wheezing or coughing. Heart: Regular rate and rhythm. Abdomen: Soft, nontender, no obvious distention. Musculoskeletal: No edema, cyanosis, or clubbing. Neuro: Alert, answering all questions appropriately. Cranial nerves grossly intact. Skin: No rashes or petechiae noted. Psych: Normal affect. Lymphatics: No cervical, calvicular, axillary or inguinal LAD.   LAB RESULTS:  Lab Results  Component Value Date   NA 138 07/24/2009   K 3.7 07/24/2009   CL 101 07/24/2009   CO2 29 07/24/2009   GLUCOSE 92 07/24/2009   BUN 11 07/24/2009   CREATININE 0.6 07/24/2009   CALCIUM 9.2 07/24/2009   PROT 7.9 07/24/2009   ALBUMIN 3.3 07/24/2009   AST 23 07/24/2009   ALT 26 07/24/2009   ALKPHOS 87 (H) 07/24/2009   BILITOT 0.40 07/24/2009    Lab Results  Component Value Date   WBC 10.4 (H) 10/15/2009   NEUTROABS 5.8 10/15/2009   HGB 14.3 10/15/2009   HCT 43.1 10/15/2009   MCV 94 10/15/2009   PLT 328 10/15/2009     STUDIES: No results found.  ASSESSMENT: Iron deficiency anemia.  PLAN:    Iron deficiency anemia: Patient recently had lab work completed on December 17, 2023 that revealed a decreased iron saturation of 14%, total iron 67, and ferritin at the low level of normal at 16.7.  Her hemoglobin is within normal limits at 13.7.  Colonoscopy and EGD in October 2024 did not reveal any significant pathology.  Since patient is symptomatic, will proceed with 200 mg IV Venofer 3 times over the next 1 to 2 weeks.  She has been instructed to continue oral iron supplementation.  Patient will then return to clinic in 4 months with repeat laboratory work, further evaluation, and continuation of  treatment if needed.  I spent a total of 45 minutes reviewing chart data, face-to-face evaluation with the patient, counseling and coordination of care as detailed above.   Patient expressed understanding and was in agreement with this plan. She also understands that She can call clinic at any time with any questions, concerns, or complaints.    Cancer Staging  No matching staging information was found for the patient.   Jeralyn Ruths, MD   12/25/2023 1:55 PM

## 2023-12-28 ENCOUNTER — Encounter: Payer: Self-pay | Admitting: Oncology

## 2023-12-29 ENCOUNTER — Inpatient Hospital Stay: Payer: BC Managed Care – PPO

## 2023-12-29 VITALS — BP 133/81 | HR 67 | Temp 97.0°F | Resp 18

## 2023-12-29 DIAGNOSIS — D509 Iron deficiency anemia, unspecified: Secondary | ICD-10-CM

## 2023-12-29 MED ORDER — SODIUM CHLORIDE 0.9% FLUSH
10.0000 mL | Freq: Once | INTRAVENOUS | Status: AC | PRN
  Administered 2023-12-29: 10 mL
  Filled 2023-12-29: qty 10

## 2023-12-29 MED ORDER — IRON SUCROSE 20 MG/ML IV SOLN
200.0000 mg | Freq: Once | INTRAVENOUS | Status: AC
  Administered 2023-12-29: 200 mg via INTRAVENOUS
  Filled 2023-12-29: qty 10

## 2024-01-01 ENCOUNTER — Inpatient Hospital Stay: Payer: 59 | Attending: Oncology

## 2024-01-01 ENCOUNTER — Encounter: Payer: Self-pay | Admitting: Oncology

## 2024-01-01 VITALS — BP 125/76 | HR 67 | Temp 97.0°F | Resp 17

## 2024-01-01 DIAGNOSIS — D509 Iron deficiency anemia, unspecified: Secondary | ICD-10-CM | POA: Insufficient documentation

## 2024-01-01 MED ORDER — SODIUM CHLORIDE 0.9% FLUSH
10.0000 mL | Freq: Once | INTRAVENOUS | Status: AC | PRN
  Administered 2024-01-01: 10 mL
  Filled 2024-01-01: qty 10

## 2024-01-01 MED ORDER — IRON SUCROSE 20 MG/ML IV SOLN
200.0000 mg | Freq: Once | INTRAVENOUS | Status: AC
  Administered 2024-01-01: 200 mg via INTRAVENOUS
  Filled 2024-01-01: qty 10

## 2024-01-01 NOTE — Progress Notes (Signed)
Patient tolerated Venofer infusion well. Explained recommendation of 30 min post monitoring. Patient refused to wait post monitoring. Educated on what signs to watch for & to call with any concerns. No questions, discharged. Stable  

## 2024-01-01 NOTE — Patient Instructions (Signed)
 Iron Sucrose Injection What is this medication? IRON SUCROSE (EYE ern SOO krose) treats low levels of iron (iron deficiency anemia) in people with kidney disease. Iron is a mineral that plays an important role in making red blood cells, which carry oxygen from your lungs to the rest of your body. This medicine may be used for other purposes; ask your health care provider or pharmacist if you have questions. COMMON BRAND NAME(S): Venofer What should I tell my care team before I take this medication? They need to know if you have any of these conditions: Anemia not caused by low iron levels Heart disease High levels of iron in the blood Kidney disease Liver disease An unusual or allergic reaction to iron, other medications, foods, dyes, or preservatives Pregnant or trying to get pregnant Breastfeeding How should I use this medication? This medication is for infusion into a vein. It is given in a hospital or clinic setting. Talk to your care team about the use of this medication in children. While this medication may be prescribed for children as young as 2 years for selected conditions, precautions do apply. Overdosage: If you think you have taken too much of this medicine contact a poison control center or emergency room at once. NOTE: This medicine is only for you. Do not share this medicine with others. What if I miss a dose? Keep appointments for follow-up doses. It is important not to miss your dose. Call your care team if you are unable to keep an appointment. What may interact with this medication? Do not take this medication with any of the following: Deferoxamine Dimercaprol Other iron products This medication may also interact with the following: Chloramphenicol Deferasirox This list may not describe all possible interactions. Give your health care provider a list of all the medicines, herbs, non-prescription drugs, or dietary supplements you use. Also tell them if you smoke,  drink alcohol, or use illegal drugs. Some items may interact with your medicine. What should I watch for while using this medication? Visit your care team regularly. Tell your care team if your symptoms do not start to get better or if they get worse. You may need blood work done while you are taking this medication. You may need to follow a special diet. Talk to your care team. Foods that contain iron include: whole grains/cereals, dried fruits, beans, or peas, leafy green vegetables, and organ meats (liver, kidney). What side effects may I notice from receiving this medication? Side effects that you should report to your care team as soon as possible: Allergic reactions--skin rash, itching, hives, swelling of the face, lips, tongue, or throat Low blood pressure--dizziness, feeling faint or lightheaded, blurry vision Shortness of breath Side effects that usually do not require medical attention (report to your care team if they continue or are bothersome): Flushing Headache Joint pain Muscle pain Nausea Pain, redness, or irritation at injection site This list may not describe all possible side effects. Call your doctor for medical advice about side effects. You may report side effects to FDA at 1-800-FDA-1088. Where should I keep my medication? This medication is given in a hospital or clinic. It will not be stored at home. NOTE: This sheet is a summary. It may not cover all possible information. If you have questions about this medicine, talk to your doctor, pharmacist, or health care provider.  2024 Elsevier/Gold Standard (2023-05-22 00:00:00)

## 2024-01-06 ENCOUNTER — Inpatient Hospital Stay: Payer: 59

## 2024-01-06 VITALS — BP 126/80 | HR 67 | Temp 95.0°F | Resp 19

## 2024-01-06 DIAGNOSIS — D509 Iron deficiency anemia, unspecified: Secondary | ICD-10-CM

## 2024-01-06 MED ORDER — IRON SUCROSE 20 MG/ML IV SOLN
200.0000 mg | Freq: Once | INTRAVENOUS | Status: AC
  Administered 2024-01-06: 200 mg via INTRAVENOUS

## 2024-01-06 MED ORDER — SODIUM CHLORIDE 0.9% FLUSH
10.0000 mL | Freq: Once | INTRAVENOUS | Status: AC | PRN
  Administered 2024-01-06: 10 mL
  Filled 2024-01-06: qty 10

## 2024-01-14 ENCOUNTER — Encounter: Payer: Self-pay | Admitting: Oncology

## 2024-04-01 ENCOUNTER — Other Ambulatory Visit: Payer: Self-pay | Admitting: Obstetrics and Gynecology

## 2024-04-01 DIAGNOSIS — Z01419 Encounter for gynecological examination (general) (routine) without abnormal findings: Secondary | ICD-10-CM

## 2024-04-01 DIAGNOSIS — N951 Menopausal and female climacteric states: Secondary | ICD-10-CM

## 2024-04-01 DIAGNOSIS — Z79890 Hormone replacement therapy (postmenopausal): Secondary | ICD-10-CM

## 2024-04-07 ENCOUNTER — Other Ambulatory Visit: Payer: Self-pay

## 2024-04-07 ENCOUNTER — Other Ambulatory Visit: Payer: Self-pay | Admitting: Obstetrics and Gynecology

## 2024-04-07 DIAGNOSIS — N951 Menopausal and female climacteric states: Secondary | ICD-10-CM

## 2024-04-07 DIAGNOSIS — Z79890 Hormone replacement therapy (postmenopausal): Secondary | ICD-10-CM

## 2024-04-07 DIAGNOSIS — Z01419 Encounter for gynecological examination (general) (routine) without abnormal findings: Secondary | ICD-10-CM

## 2024-04-07 MED ORDER — ESTRADIOL 1 MG PO TABS: 1.0000 mg | ORAL_TABLET | Freq: Every day | ORAL | 0 refills | Status: DC

## 2024-04-08 MED ORDER — ESTRADIOL 1 MG PO TABS
1.0000 mg | ORAL_TABLET | Freq: Every day | ORAL | 0 refills | Status: DC
Start: 2024-04-08 — End: 2024-05-25

## 2024-04-08 NOTE — Addendum Note (Signed)
 Addended by: Loman Chroman on: 04/08/2024 08:18 AM   Modules accepted: Orders

## 2024-04-22 ENCOUNTER — Other Ambulatory Visit: Payer: Self-pay | Admitting: *Deleted

## 2024-04-22 DIAGNOSIS — D509 Iron deficiency anemia, unspecified: Secondary | ICD-10-CM

## 2024-04-25 ENCOUNTER — Ambulatory Visit: Payer: BC Managed Care – PPO

## 2024-04-25 ENCOUNTER — Inpatient Hospital Stay: Payer: BC Managed Care – PPO | Attending: Oncology

## 2024-04-25 ENCOUNTER — Inpatient Hospital Stay: Payer: BC Managed Care – PPO | Admitting: Oncology

## 2024-04-25 ENCOUNTER — Encounter: Payer: Self-pay | Admitting: Oncology

## 2024-04-25 VITALS — BP 130/86 | HR 69 | Temp 98.6°F | Resp 20 | Wt 245.6 lb

## 2024-04-25 DIAGNOSIS — Z87891 Personal history of nicotine dependence: Secondary | ICD-10-CM | POA: Insufficient documentation

## 2024-04-25 DIAGNOSIS — D509 Iron deficiency anemia, unspecified: Secondary | ICD-10-CM | POA: Diagnosis not present

## 2024-04-25 LAB — CBC WITH DIFFERENTIAL/PLATELET
Abs Immature Granulocytes: 0.03 10*3/uL (ref 0.00–0.07)
Basophils Absolute: 0.1 10*3/uL (ref 0.0–0.1)
Basophils Relative: 2 %
Eosinophils Absolute: 0.1 10*3/uL (ref 0.0–0.5)
Eosinophils Relative: 2 %
HCT: 43.1 % (ref 36.0–46.0)
Hemoglobin: 14.2 g/dL (ref 12.0–15.0)
Immature Granulocytes: 1 %
Lymphocytes Relative: 39 %
Lymphs Abs: 2.4 10*3/uL (ref 0.7–4.0)
MCH: 29.4 pg (ref 26.0–34.0)
MCHC: 32.9 g/dL (ref 30.0–36.0)
MCV: 89.2 fL (ref 80.0–100.0)
Monocytes Absolute: 0.6 10*3/uL (ref 0.1–1.0)
Monocytes Relative: 10 %
Neutro Abs: 2.9 10*3/uL (ref 1.7–7.7)
Neutrophils Relative %: 46 %
Platelets: 261 10*3/uL (ref 150–400)
RBC: 4.83 MIL/uL (ref 3.87–5.11)
RDW: 13.2 % (ref 11.5–15.5)
WBC: 6.2 10*3/uL (ref 4.0–10.5)
nRBC: 0 % (ref 0.0–0.2)

## 2024-04-25 LAB — IRON AND TIBC
Iron: 101 ug/dL (ref 28–170)
Saturation Ratios: 26 % (ref 10.4–31.8)
TIBC: 382 ug/dL (ref 250–450)
UIBC: 281 ug/dL

## 2024-04-25 LAB — FERRITIN: Ferritin: 96 ng/mL (ref 11–307)

## 2024-04-25 NOTE — Progress Notes (Signed)
 Patient states her energy level is back down and she can't seem to stay warm.

## 2024-04-25 NOTE — Progress Notes (Signed)
 [facility]  [phone] [phone]  ID: Sydney Hawkins OB: 1976-03-18  MR#: [id number]  AOZ#:[id number]  Patient Care Team: Rae Bugler, MD as PCP - General (Family Medicine) Shellie Dials, MD as Consulting Physician (Oncology)  CHIEF COMPLAINT: Iron  deficiency.  INTERVAL HISTORY: Patient returns to clinic today for repeat laboratory work, further evaluation, and consideration of additional IV Venofer .  After receiving treatment several months ago she felt significantly improved, but over the past several weeks has felt increasing fatigue.  She otherwise feels well. She has no neurologic complaints.  She denies any recent fevers or illnesses.  She has a good appetite and denies weight loss.  She has no chest pain, shortness of breath, cough, or hemoptysis.  She denies any nausea, vomiting, constipation, or diarrhea.  She has no melena or hematochezia.  She has no urinary complaints.  Patient offers no further specific complaints today.  REVIEW OF SYSTEMS:   Review of Systems  Constitutional:  Positive for malaise/fatigue. Negative for fever.  Respiratory: Negative.  Negative for cough, hemoptysis and shortness of breath.   Cardiovascular: Negative.  Negative for chest pain and leg swelling.  Gastrointestinal: Negative.  Negative for abdominal pain, blood in stool and melena.  Genitourinary: Negative.  Negative for dysuria and hematuria.  Musculoskeletal: Negative.  Negative for back pain.  Skin: Negative.  Negative for rash.  Neurological: Negative.  Negative for dizziness, focal weakness, weakness and headaches.  Psychiatric/Behavioral: Negative.  The patient is not nervous/anxious.     As per HPI. Otherwise, a complete review of systems is negative.  PAST MEDICAL HISTORY: Past Medical History:  Diagnosis Date   Anxiety    Cervical cancer (HCC)    Chronic low back pain    Insomnia    Leukocytosis    Obesity    Tobacco dependence      PAST SURGICAL HISTORY: Past Surgical History:  Procedure Laterality Date   ABDOMINAL HYSTERECTOMY     BACK SURGERY     BREAST BIOPSY     BREAST SURGERY     CERVICAL SPINE SURGERY     CESAREAN SECTION     TOTAL HIP ARTHROPLASTY Right     FAMILY HISTORY: Family History  Problem Relation Age of Onset   Diabetes Mellitus II Mother        Living   Stroke Father        Deceased, 87   Hypertension Maternal Grandfather    Heart attack Maternal Grandfather    Hyperlipidemia Maternal Grandfather    Diabetes Mellitus II Paternal Grandmother     ADVANCED DIRECTIVES (Y/N):  N  HEALTH MAINTENANCE: Social History   Tobacco Use   Smoking status: Former    Current packs/day: 0.00    Average packs/day: 0.3 packs/day for 25.0 years (6.3 ttl pk-yrs)    Types: Cigarettes    Start date: 07/1995    Quit date: 07/2020    Years since quitting: 3.7  Vaping Use   Vaping status: Never Used  Substance Use Topics   Alcohol use: Yes    Alcohol/week: 0.0 standard drinks of alcohol    Comment: Special occasions   Drug use: No     Colonoscopy:  PAP:  Bone density:  Lipid panel:  Allergies  Allergen Reactions   Advil [Ibuprofen] Hives    Current Outpatient Medications  Medication Sig Dispense Refill   ALPRAZolam (XANAX) 0.5 MG tablet Take 0.5 mg by mouth at bedtime as needed.     estradiol  (  ESTRACE ) 1 MG tablet Take 1 tablet (1 mg total) by mouth daily. 30 tablet 0   ferrous sulfate ER (SLOW FE) 142 (45 Fe) MG TBCR tablet Take by mouth.     HYDROcodone-acetaminophen (NORCO/VICODIN) 5-325 MG per tablet Take 1 tablet by mouth every 6 (six) hours as needed for moderate pain.     loratadine (CLARITIN) 10 MG tablet Take 10 mg by mouth daily.     MOUNJARO 2.5 MG/0.5ML Pen Inject 2.5 mg into the skin once a week.     OMEPRAZOLE PO Take by mouth.     Vitamin D, Cholecalciferol, 1000 UNITS TABS Take by mouth.     No current facility-administered medications for this visit.     OBJECTIVE: Vitals:   04/25/24 1319  BP: 130/86  Pulse: 69  Resp: 20  Temp: 98.6 F (37 C)  SpO2: 100%     Body mass index is 39.64 kg/m.    ECOG FS:0 - Asymptomatic  General: Well-developed, well-nourished, no acute distress. Eyes: Pink conjunctiva, anicteric sclera. HEENT: Normocephalic, moist mucous membranes. Lungs: No audible wheezing or coughing. Heart: Regular rate and rhythm. Abdomen: Soft, nontender, no obvious distention. Musculoskeletal: No edema, cyanosis, or clubbing. Neuro: Alert, answering all questions appropriately. Cranial nerves grossly intact. Skin: No rashes or petechiae noted. Psych: Normal affect.  LAB RESULTS:  Lab Results  Component Value Date   NA 138 07/24/2009   K 3.7 07/24/2009   CL 101 07/24/2009   CO2 29 07/24/2009   GLUCOSE 92 07/24/2009   BUN 11 07/24/2009   CREATININE 0.6 07/24/2009   CALCIUM 9.2 07/24/2009   PROT 7.9 07/24/2009   ALBUMIN 3.3 07/24/2009   AST 23 07/24/2009   ALT 26 07/24/2009   ALKPHOS 87 (H) 07/24/2009   BILITOT 0.40 07/24/2009    Lab Results  Component Value Date   WBC 6.2 04/25/2024   NEUTROABS 2.9 04/25/2024   HGB 14.2 04/25/2024   HCT 43.1 04/25/2024   MCV 89.2 04/25/2024   PLT 261 04/25/2024   Lab Results  Component Value Date   IRON  101 04/25/2024   TIBC 382 04/25/2024   IRONPCTSAT 26 04/25/2024   Lab Results  Component Value Date   FERRITIN 96 04/25/2024     STUDIES: No results found.  ASSESSMENT: Iron  deficiency anemia.  PLAN:    Iron  deficiency anemia: Patient's hemoglobin and iron  stores are now within normal limits.  Colonoscopy and EGD in October 2024 did not reveal any significant pathology.  She does not require additional IV Venofer  at this time.  Patient last received treatment on January 2025.  No intervention is needed.  Return to clinic in 4 months with repeat laboratory work, further evaluation, and consideration of additional treatment if necessary.   I spent a total  of 20 minutes reviewing chart data, face-to-face evaluation with the patient, counseling and coordination of care as detailed above.    Patient expressed understanding and was in agreement with this plan. She also understands that She can call clinic at any time with any questions, concerns, or complaints.    Cancer Staging  No matching staging information was found for the patient.   Shellie Dials, MD   04/25/2024 4:51 PM

## 2024-04-26 ENCOUNTER — Inpatient Hospital Stay: Payer: BC Managed Care – PPO

## 2024-04-27 ENCOUNTER — Ambulatory Visit: Admitting: Obstetrics and Gynecology

## 2024-04-27 DIAGNOSIS — Z1231 Encounter for screening mammogram for malignant neoplasm of breast: Secondary | ICD-10-CM

## 2024-04-27 DIAGNOSIS — Z79890 Hormone replacement therapy (postmenopausal): Secondary | ICD-10-CM

## 2024-04-27 DIAGNOSIS — Z01419 Encounter for gynecological examination (general) (routine) without abnormal findings: Secondary | ICD-10-CM

## 2024-05-26 ENCOUNTER — Encounter: Payer: Self-pay | Admitting: Obstetrics and Gynecology

## 2024-05-26 ENCOUNTER — Ambulatory Visit (INDEPENDENT_AMBULATORY_CARE_PROVIDER_SITE_OTHER): Admitting: Obstetrics and Gynecology

## 2024-05-26 VITALS — BP 116/78 | HR 71 | Ht 66.0 in | Wt 245.8 lb

## 2024-05-26 DIAGNOSIS — Z79890 Hormone replacement therapy (postmenopausal): Secondary | ICD-10-CM

## 2024-05-26 DIAGNOSIS — Z01419 Encounter for gynecological examination (general) (routine) without abnormal findings: Secondary | ICD-10-CM

## 2024-05-26 DIAGNOSIS — Z1231 Encounter for screening mammogram for malignant neoplasm of breast: Secondary | ICD-10-CM

## 2024-05-26 DIAGNOSIS — Z1211 Encounter for screening for malignant neoplasm of colon: Secondary | ICD-10-CM

## 2024-05-26 DIAGNOSIS — N951 Menopausal and female climacteric states: Secondary | ICD-10-CM

## 2024-05-26 MED ORDER — ESTRADIOL 1 MG PO TABS: 1.0000 mg | ORAL_TABLET | Freq: Every day | ORAL | 3 refills | Status: AC

## 2024-05-26 NOTE — Progress Notes (Signed)
 HPI:      [name] is a 48 y.o. [id number] who LMP was No LMP recorded. Patient has had a hysterectomy.  Subjective:   She presents today for her annual examination.  She has no complaints.  She reports that she is doing very well.  No issues with ERT.  Would like to continue. Part of administration at [employer]. Her daughter is pregnant and she will now become a grandmother in July. Excited and happy that both her daughter and her son are going to be living on her property.    Hx: The following portions of the patient's history were reviewed and updated as appropriate:             She  has a past medical history of Anxiety, Cervical cancer (HCC), Chronic low back pain, Insomnia, Leukocytosis, Obesity, and Tobacco dependence. She does not have any pertinent problems on file. She  has a past surgical history that includes Cesarean section; Abdominal hysterectomy; Back surgery; Breast surgery; Cervical spine surgery; Total hip arthroplasty (Right); and Breast biopsy. Her family history includes Diabetes Mellitus II in her mother and paternal grandmother; Heart attack in her maternal grandfather; Hyperlipidemia in her maternal grandfather; Hypertension in her maternal grandfather; Stroke in her father. She  reports that she quit smoking about 3 years ago. Her smoking use included cigarettes. She started smoking about 28 years ago. She has a 6.3 pack-year smoking history. She does not have any smokeless tobacco history on file. She reports current alcohol use. She reports that she does not use drugs. She has a current medication list which includes the following prescription(s): ferrous sulfate er, hydrocodone-acetaminophen, loratadine, mounjaro, omeprazole, vitamin d (cholecalciferol), and estradiol . She is allergic to advil [ibuprofen].       Review of Systems:  Review of Systems  Constitutional: Denied constitutional symptoms, night sweats, recent illness, fatigue,  fever, insomnia and weight loss.  Eyes: Denied eye symptoms, eye pain, photophobia, vision change and visual disturbance.  Ears/Nose/Throat/Neck: Denied ear, nose, throat or neck symptoms, hearing loss, nasal discharge, sinus congestion and sore throat.  Cardiovascular: Denied cardiovascular symptoms, arrhythmia, chest pain/pressure, edema, exercise intolerance, orthopnea and palpitations.  Respiratory: Denied pulmonary symptoms, asthma, pleuritic pain, productive sputum, cough, dyspnea and wheezing.  Gastrointestinal: Denied, gastro-esophageal reflux, melena, nausea and vomiting.  Genitourinary: Denied genitourinary symptoms including symptomatic vaginal discharge, pelvic relaxation issues, and urinary complaints.  Musculoskeletal: Denied musculoskeletal symptoms, stiffness, swelling, muscle weakness and myalgia.  Dermatologic: Denied dermatology symptoms, rash and scar.  Neurologic: Denied neurology symptoms, dizziness, headache, neck pain and syncope.  Psychiatric: Denied psychiatric symptoms, anxiety and depression.  Endocrine: Denied endocrine symptoms including hot flashes and night sweats.   Meds:   Current Outpatient Medications on File Prior to Visit  Medication Sig Dispense Refill   ferrous sulfate ER (SLOW FE) 142 (45 Fe) MG TBCR tablet Take by mouth.     HYDROcodone-acetaminophen (NORCO/VICODIN) 5-325 MG per tablet Take 1 tablet by mouth every 6 (six) hours as needed for moderate pain.     loratadine (CLARITIN) 10 MG tablet Take 10 mg by mouth daily.     MOUNJARO 2.5 MG/0.5ML Pen Inject 2.5 mg into the skin once a week.     OMEPRAZOLE PO Take by mouth.     Vitamin D, Cholecalciferol, 1000 UNITS TABS Take by mouth.     No current facility-administered medications on file prior to visit.     Objective:      Vitals:  05/26/24 0946  BP: 116/78  Pulse: 71    Filed Weights   05/26/24 0946  Weight: 245 lb 12.8 oz (111.5 kg)              Patient deferred breast and  pelvic exam today as she is having no symptoms.  Assessment:     [id number] Patient Active Problem List   Diagnosis Date Noted   Iron  deficiency anemia, unspecified 12/25/2023     1. Well woman exam with routine gynecological exam   2. Postmenopausal hormone therapy   3. Symptomatic menopausal or female climacteric states   4. Screening mammogram for breast cancer   5. Screen for colon cancer        Plan:            1.  Basic Screening Recommendations The basic screening recommendations for asymptomatic women were discussed with the patient during her visit.  The age-appropriate recommendations were discussed with her and the rational for the tests reviewed.  When I am informed by the patient that another primary care physician has previously obtained the age-appropriate tests and they are up-to-date, only outstanding tests are ordered and referrals given as necessary.  Abnormal results of tests will be discussed with her when all of her results are completed.  Routine preventative health maintenance measures emphasized: Exercise/Diet/Weight control, Tobacco Warnings, Alcohol/Substance use risks and Stress Management Pap ordered 2.  Continue ERT. Orders Orders Placed This Encounter  Procedures   MM DIGITAL SCREENING BILATERAL     Meds ordered this encounter  Medications   estradiol  (ESTRACE ) 1 MG tablet    Sig: Take 1 tablet (1 mg total) by mouth daily.    Dispense:  90 tablet    Refill:  3          F/U  Return in about 1 year (around 05/26/2025) for Annual Physical.  Delice Felt, M.D. 05/26/2024 10:07 AM

## 2024-05-26 NOTE — Progress Notes (Signed)
 Patients presents for annual exam today. She states doing well with current HRT, refilled for 1 year. Due for mammogram, ordered. Annual labs with PCP. States no other questions or concerns at this time.

## 2024-08-19 ENCOUNTER — Other Ambulatory Visit: Payer: Self-pay | Admitting: *Deleted

## 2024-08-19 DIAGNOSIS — D509 Iron deficiency anemia, unspecified: Secondary | ICD-10-CM

## 2024-08-22 ENCOUNTER — Inpatient Hospital Stay: Attending: Oncology

## 2024-08-22 DIAGNOSIS — Z87891 Personal history of nicotine dependence: Secondary | ICD-10-CM | POA: Insufficient documentation

## 2024-08-22 DIAGNOSIS — D509 Iron deficiency anemia, unspecified: Secondary | ICD-10-CM | POA: Insufficient documentation

## 2024-08-22 LAB — CBC WITH DIFFERENTIAL/PLATELET
Abs Immature Granulocytes: 0.03 K/uL (ref 0.00–0.07)
Basophils Absolute: 0.1 K/uL (ref 0.0–0.1)
Basophils Relative: 2 %
Eosinophils Absolute: 0.1 K/uL (ref 0.0–0.5)
Eosinophils Relative: 2 %
HCT: 39.8 % (ref 36.0–46.0)
Hemoglobin: 13.2 g/dL (ref 12.0–15.0)
Immature Granulocytes: 1 %
Lymphocytes Relative: 43 %
Lymphs Abs: 2.1 K/uL (ref 0.7–4.0)
MCH: 29.5 pg (ref 26.0–34.0)
MCHC: 33.2 g/dL (ref 30.0–36.0)
MCV: 89 fL (ref 80.0–100.0)
Monocytes Absolute: 0.5 K/uL (ref 0.1–1.0)
Monocytes Relative: 10 %
Neutro Abs: 2.1 K/uL (ref 1.7–7.7)
Neutrophils Relative %: 42 %
Platelets: 248 K/uL (ref 150–400)
RBC: 4.47 MIL/uL (ref 3.87–5.11)
RDW: 12.7 % (ref 11.5–15.5)
WBC: 5 K/uL (ref 4.0–10.5)
nRBC: 0 % (ref 0.0–0.2)

## 2024-08-22 LAB — FERRITIN: Ferritin: 68 ng/mL (ref 11–307)

## 2024-08-22 LAB — IRON AND TIBC
Iron: 79 ug/dL (ref 28–170)
Saturation Ratios: 23 % (ref 10.4–31.8)
TIBC: 351 ug/dL (ref 250–450)
UIBC: 272 ug/dL

## 2024-08-24 ENCOUNTER — Inpatient Hospital Stay: Admitting: Oncology

## 2024-08-24 ENCOUNTER — Inpatient Hospital Stay

## 2024-08-24 ENCOUNTER — Encounter: Payer: Self-pay | Admitting: Oncology

## 2024-08-24 VITALS — BP 120/88 | HR 76 | Temp 97.5°F | Resp 18 | Ht 66.0 in | Wt 240.0 lb

## 2024-08-24 DIAGNOSIS — D509 Iron deficiency anemia, unspecified: Secondary | ICD-10-CM | POA: Diagnosis not present

## 2024-08-24 NOTE — Progress Notes (Signed)
 Patient is doing ok, just feeling a little fatigue.

## 2024-08-24 NOTE — Progress Notes (Signed)
 [employer], [facility]  [phone] [phone]) [phone]  ID: Sydney Hawkins OB: Apr 05, 1976  MR#: [id number]  RDW#:[id number]  Patient Care Team: Seabron Lenis, MD as PCP - General (Family Medicine) Jacobo Evalene PARAS, MD as Consulting Physician (Oncology)  CHIEF COMPLAINT: Iron  deficiency.  INTERVAL HISTORY: Patient returns to clinic today for repeat laboratory work, further evaluation, and consideration of additional IV Venofer .  She continues to have mild fatigue, but otherwise feels well.  She has no neurologic complaints.  She denies any recent fevers or illnesses.  She has a good appetite and denies weight loss.  She has no chest pain, shortness of breath, cough, or hemoptysis.  She denies any nausea, vomiting, constipation, or diarrhea.  She has no melena or hematochezia.  She has no urinary complaints.  Patient offers no further specific complaints today.  REVIEW OF SYSTEMS:   Review of Systems  Constitutional:  Positive for malaise/fatigue. Negative for fever.  Respiratory: Negative.  Negative for cough, hemoptysis and shortness of breath.   Cardiovascular: Negative.  Negative for chest pain and leg swelling.  Gastrointestinal: Negative.  Negative for abdominal pain, blood in stool and melena.  Genitourinary: Negative.  Negative for dysuria and hematuria.  Musculoskeletal: Negative.  Negative for back pain.  Skin: Negative.  Negative for rash.  Neurological: Negative.  Negative for dizziness, focal weakness, weakness and headaches.  Psychiatric/Behavioral: Negative.  The patient is not nervous/anxious.     As per HPI. Otherwise, a complete review of systems is negative.  PAST MEDICAL HISTORY: Past Medical History:  Diagnosis Date   Anxiety    Cervical cancer (HCC)    Chronic low back pain    Insomnia    Leukocytosis    Obesity    Tobacco dependence     PAST SURGICAL HISTORY: Past Surgical History:   Procedure Laterality Date   ABDOMINAL HYSTERECTOMY     BACK SURGERY     BREAST BIOPSY     BREAST SURGERY     CERVICAL SPINE SURGERY     CESAREAN SECTION     TOTAL HIP ARTHROPLASTY Right     FAMILY HISTORY: Family History  Problem Relation Age of Onset   Diabetes Mellitus II Mother        Living   Stroke Father        Deceased, 54   Hypertension Maternal Grandfather    Heart attack Maternal Grandfather    Hyperlipidemia Maternal Grandfather    Diabetes Mellitus II Paternal Grandmother     ADVANCED DIRECTIVES (Y/N):  N  HEALTH MAINTENANCE: Social History   Tobacco Use   Smoking status: Former    Current packs/day: 0.00    Average packs/day: 0.3 packs/day for 25.0 years (6.3 ttl pk-yrs)    Types: Cigarettes    Start date: 07/1995    Quit date: 07/2020    Years since quitting: 4.0  Vaping Use   Vaping status: Never Used  Substance Use Topics   Alcohol use: Yes    Alcohol/week: 0.0 standard drinks of alcohol    Comment: Special occasions   Drug use: No     Colonoscopy:  PAP:  Bone density:  Lipid panel:  Allergies  Allergen Reactions   Advil [Ibuprofen] Hives    Current Outpatient Medications  Medication Sig Dispense Refill   estradiol  (ESTRACE ) 1 MG tablet Take 1 tablet (1 mg total) by mouth daily. 90 tablet 3   ferrous sulfate ER (SLOW FE) 142 (45 Fe) MG TBCR  tablet Take by mouth.     HYDROcodone-acetaminophen (NORCO/VICODIN) 5-325 MG per tablet Take 1 tablet by mouth every 6 (six) hours as needed for moderate pain.     loratadine (CLARITIN) 10 MG tablet Take 10 mg by mouth daily.     MOUNJARO 2.5 MG/0.5ML Pen Inject 2.5 mg into the skin once a week.     OMEPRAZOLE PO Take by mouth.     Vitamin D, Cholecalciferol, 1000 UNITS TABS Take by mouth.     No current facility-administered medications for this visit.    OBJECTIVE: Vitals:   08/24/24 0944  BP: 120/88  Pulse: 76  Resp: 18  Temp: (!) 97.5 F (36.4 C)  SpO2: 96%     Body mass index is  38.74 kg/m.    ECOG FS:0 - Asymptomatic  General: Well-developed, well-nourished, no acute distress. Eyes: Pink conjunctiva, anicteric sclera. HEENT: Normocephalic, moist mucous membranes. Lungs: No audible wheezing or coughing. Heart: Regular rate and rhythm. Abdomen: Soft, nontender, no obvious distention. Musculoskeletal: No edema, cyanosis, or clubbing. Neuro: Alert, answering all questions appropriately. Cranial nerves grossly intact. Skin: No rashes or petechiae noted. Psych: Normal affect.  LAB RESULTS:  Lab Results  Component Value Date   NA 138 07/24/2009   K 3.7 07/24/2009   CL 101 07/24/2009   CO2 29 07/24/2009   GLUCOSE 92 07/24/2009   BUN 11 07/24/2009   CREATININE 0.6 07/24/2009   CALCIUM 9.2 07/24/2009   PROT 7.9 07/24/2009   ALBUMIN 3.3 07/24/2009   AST 23 07/24/2009   ALT 26 07/24/2009   ALKPHOS 87 (H) 07/24/2009   BILITOT 0.40 07/24/2009    Lab Results  Component Value Date   WBC 5.0 08/22/2024   NEUTROABS 2.1 08/22/2024   HGB 13.2 08/22/2024   HCT 39.8 08/22/2024   MCV 89.0 08/22/2024   PLT 248 08/22/2024   Lab Results  Component Value Date   IRON  79 08/22/2024   TIBC 351 08/22/2024   IRONPCTSAT 23 08/22/2024   Lab Results  Component Value Date   FERRITIN 68 08/22/2024     STUDIES: No results found.  ASSESSMENT: Iron  deficiency anemia.  PLAN:    Iron  deficiency anemia: Resolved.  Patient's hemoglobin and iron  stores continue to be within normal limits. Colonoscopy and EGD in October 2024 did not reveal any significant pathology.  She does not require additional IV Venofer  at this time.  Patient last received treatment on January 2025.  No intervention is needed at this time.  Return to clinic in 4 months with repeat laboratory work, further evaluation, and consideration of additional treatment if necessary.  If patient's laboratory work continues to be within normal limits, can consider discharging from clinic.  I spent a total of 20  minutes reviewing chart data, face-to-face evaluation with the patient, counseling and coordination of care as detailed above.   Patient expressed understanding and was in agreement with this plan. She also understands that She can call clinic at any time with any questions, concerns, or complaints.    Evalene JINNY Reusing, MD   08/24/2024 4:43 PM

## 2024-09-19 ENCOUNTER — Ambulatory Visit
Admission: RE | Admit: 2024-09-19 | Discharge: 2024-09-19 | Disposition: A | Discharge status: Home / Self Care | Source: Ambulatory Visit | Attending: Obstetrics and Gynecology | Admitting: Obstetrics and Gynecology

## 2024-09-19 DIAGNOSIS — Z01419 Encounter for gynecological examination (general) (routine) without abnormal findings: Secondary | ICD-10-CM

## 2024-09-19 DIAGNOSIS — Z1231 Encounter for screening mammogram for malignant neoplasm of breast: Secondary | ICD-10-CM | POA: Diagnosis present

## 2024-11-28 ENCOUNTER — Encounter: Payer: Self-pay | Admitting: Oncology

## 2024-12-07 ENCOUNTER — Inpatient Hospital Stay: Attending: Oncology

## 2024-12-07 DIAGNOSIS — R110 Nausea: Secondary | ICD-10-CM | POA: Insufficient documentation

## 2024-12-07 DIAGNOSIS — R531 Weakness: Secondary | ICD-10-CM | POA: Insufficient documentation

## 2024-12-07 DIAGNOSIS — Z87891 Personal history of nicotine dependence: Secondary | ICD-10-CM | POA: Insufficient documentation

## 2024-12-07 DIAGNOSIS — R5383 Other fatigue: Secondary | ICD-10-CM | POA: Diagnosis not present

## 2024-12-07 DIAGNOSIS — D509 Iron deficiency anemia, unspecified: Secondary | ICD-10-CM

## 2024-12-07 DIAGNOSIS — Z862 Personal history of diseases of the blood and blood-forming organs and certain disorders involving the immune mechanism: Secondary | ICD-10-CM | POA: Insufficient documentation

## 2024-12-07 LAB — CBC WITH DIFFERENTIAL/PLATELET
Abs Immature Granulocytes: 0.01 K/uL (ref 0.00–0.07)
Basophils Absolute: 0.1 K/uL (ref 0.0–0.1)
Basophils Relative: 1 %
Eosinophils Absolute: 0.2 K/uL (ref 0.0–0.5)
Eosinophils Relative: 3 %
HCT: 41.4 % (ref 36.0–46.0)
Hemoglobin: 13.7 g/dL (ref 12.0–15.0)
Immature Granulocytes: 0 %
Lymphocytes Relative: 50 %
Lymphs Abs: 3.3 K/uL (ref 0.7–4.0)
MCH: 29.6 pg (ref 26.0–34.0)
MCHC: 33.1 g/dL (ref 30.0–36.0)
MCV: 89.4 fL (ref 80.0–100.0)
Monocytes Absolute: 0.7 K/uL (ref 0.1–1.0)
Monocytes Relative: 10 %
Neutro Abs: 2.5 K/uL (ref 1.7–7.7)
Neutrophils Relative %: 36 %
Platelets: 243 K/uL (ref 150–400)
RBC: 4.63 MIL/uL (ref 3.87–5.11)
RDW: 12.8 % (ref 11.5–15.5)
WBC: 6.7 K/uL (ref 4.0–10.5)
nRBC: 0 % (ref 0.0–0.2)

## 2024-12-07 LAB — FERRITIN: Ferritin: 150 ng/mL (ref 11–307)

## 2024-12-07 LAB — IRON AND TIBC
Iron: 48 ug/dL (ref 28–170)
Saturation Ratios: 15 % (ref 10.4–31.8)
TIBC: 328 ug/dL (ref 250–450)
UIBC: 280 ug/dL

## 2024-12-08 ENCOUNTER — Inpatient Hospital Stay

## 2024-12-08 ENCOUNTER — Inpatient Hospital Stay: Admitting: Oncology

## 2024-12-08 ENCOUNTER — Encounter: Payer: Self-pay | Admitting: Oncology

## 2024-12-08 VITALS — BP 114/80 | HR 87 | Temp 97.6°F | Resp 18 | Ht 66.0 in | Wt 230.0 lb

## 2024-12-08 DIAGNOSIS — D509 Iron deficiency anemia, unspecified: Secondary | ICD-10-CM | POA: Diagnosis not present

## 2024-12-08 DIAGNOSIS — Z862 Personal history of diseases of the blood and blood-forming organs and certain disorders involving the immune mechanism: Secondary | ICD-10-CM | POA: Diagnosis not present

## 2024-12-08 MED ORDER — PROCHLORPERAZINE MALEATE 10 MG PO TABS: 10.0000 mg | ORAL_TABLET | Freq: Four times a day (QID) | ORAL | 0 refills | Status: AC | PRN

## 2024-12-08 NOTE — Progress Notes (Signed)
 [employer], [facility]  [phone] [phone]  ID: Sydney Hawkins OB: May 22, 1976  MR#: [id number]  RDW#:[id number]  Patient Care Team: Seabron Lenis, MD as PCP - General (Family Medicine) Jacobo Evalene PARAS, MD as Consulting Physician (Oncology)  CHIEF COMPLAINT: Iron  deficiency.  INTERVAL HISTORY: Patient returns to clinic today as an add-on with increasing nausea, poor appetite, and weakness and fatigue.  She otherwise feels well.  She has no neurologic complaints.  She denies any recent fevers or illnesses.  She has no chest pain, shortness of breath, cough, or hemoptysis.  She denies any vomiting, constipation, or diarrhea.  She has no melena or hematochezia.  She has no urinary complaints.  Patient offers no further specific complaints today.  REVIEW OF SYSTEMS:   Review of Systems  Constitutional:  Positive for malaise/fatigue and weight loss. Negative for fever.  Respiratory: Negative.  Negative for cough, hemoptysis and shortness of breath.   Cardiovascular: Negative.  Negative for chest pain and leg swelling.  Gastrointestinal:  Positive for nausea. Negative for abdominal pain, blood in stool and melena.  Genitourinary: Negative.  Negative for dysuria and hematuria.  Musculoskeletal: Negative.  Negative for back pain.  Skin: Negative.  Negative for rash.  Neurological:  Positive for weakness. Negative for dizziness, focal weakness and headaches.  Psychiatric/Behavioral: Negative.  The patient is not nervous/anxious.     As per HPI. Otherwise, a complete review of systems is negative.  PAST MEDICAL HISTORY: Past Medical History:  Diagnosis Date   Anxiety    Cervical cancer (HCC)    Chronic low back pain    Insomnia    Leukocytosis    Obesity    Tobacco dependence     PAST SURGICAL HISTORY: Past Surgical History:  Procedure Laterality Date   ABDOMINAL HYSTERECTOMY     BACK SURGERY     BREAST  BIOPSY     BREAST SURGERY     CERVICAL SPINE SURGERY     CESAREAN SECTION     TOTAL HIP ARTHROPLASTY Right     FAMILY HISTORY: Family History  Problem Relation Age of Onset   Diabetes Mellitus II Mother        Living   Stroke Father        Deceased, 44   Hypertension Maternal Grandfather    Heart attack Maternal Grandfather    Hyperlipidemia Maternal Grandfather    Diabetes Mellitus II Paternal Grandmother    Breast cancer Neg Hx     ADVANCED DIRECTIVES (Y/N):  N  HEALTH MAINTENANCE: Social History   Tobacco Use   Smoking status: Former    Current packs/day: 0.00    Average packs/day: 0.3 packs/day for 25.0 years (6.3 ttl pk-yrs)    Types: Cigarettes    Start date: 07/1995    Quit date: 07/2020    Years since quitting: 4.3  Vaping Use   Vaping status: Never Used  Substance Use Topics   Alcohol use: Yes    Alcohol/week: 0.0 standard drinks of alcohol    Comment: Special occasions   Drug use: No     Colonoscopy:  PAP:  Bone density:  Lipid panel:  Allergies  Allergen Reactions   Advil [Ibuprofen] Hives    Current Outpatient Medications  Medication Sig Dispense Refill   omeprazole (PRILOSEC) 20 MG capsule Take 20 mg by mouth daily as needed.     prochlorperazine (COMPAZINE) 10 MG tablet Take 1 tablet (10 mg total) by mouth every 6 (six)  hours as needed for nausea or vomiting. 60 tablet 0   estradiol  (ESTRACE ) 1 MG tablet Take 1 tablet (1 mg total) by mouth daily. 90 tablet 3   ferrous sulfate ER (SLOW FE) 142 (45 Fe) MG TBCR tablet Take by mouth.     HYDROcodone-acetaminophen (NORCO/VICODIN) 5-325 MG per tablet Take 1 tablet by mouth every 6 (six) hours as needed for moderate pain.     loratadine (CLARITIN) 10 MG tablet Take 10 mg by mouth daily.     MOUNJARO 15 MG/0.5ML Pen SMARTSIG:15 Milligram(s) SUB-Q Once a Week     MOUNJARO 2.5 MG/0.5ML Pen Inject 2.5 mg into the skin once a week.     OMEPRAZOLE PO Take by mouth.     Vitamin D, Cholecalciferol, 1000  UNITS TABS Take by mouth.     No current facility-administered medications for this visit.    OBJECTIVE: Vitals:   12/08/24 1007  BP: 114/80  Pulse: 87  Resp: 18  Temp: 97.6 F (36.4 C)  SpO2: 99%     Body mass index is 37.12 kg/m.    ECOG FS:1 - Symptomatic but completely ambulatory  General: Well-developed, well-nourished, no acute distress. Eyes: Pink conjunctiva, anicteric sclera. HEENT: Normocephalic, moist mucous membranes. Lungs: No audible wheezing or coughing. Heart: Regular rate and rhythm. Abdomen: Soft, nontender, no obvious distention. Musculoskeletal: No edema, cyanosis, or clubbing. Neuro: Alert, answering all questions appropriately. Cranial nerves grossly intact. Skin: No rashes or petechiae noted. Psych: Normal affect.  LAB RESULTS:  Lab Results  Component Value Date   NA 138 07/24/2009   K 3.7 07/24/2009   CL 101 07/24/2009   CO2 29 07/24/2009   GLUCOSE 92 07/24/2009   BUN 11 07/24/2009   CREATININE 0.6 07/24/2009   CALCIUM 9.2 07/24/2009   PROT 7.9 07/24/2009   ALBUMIN 3.3 07/24/2009   AST 23 07/24/2009   ALT 26 07/24/2009   ALKPHOS 87 (H) 07/24/2009   BILITOT 0.40 07/24/2009    Lab Results  Component Value Date   WBC 6.7 12/07/2024   NEUTROABS 2.5 12/07/2024   HGB 13.7 12/07/2024   HCT 41.4 12/07/2024   MCV 89.4 12/07/2024   PLT 243 12/07/2024   Lab Results  Component Value Date   IRON  48 12/07/2024   TIBC 328 12/07/2024   IRONPCTSAT 15 12/07/2024   Lab Results  Component Value Date   FERRITIN 150 12/07/2024     STUDIES: No results found.  ASSESSMENT: Iron  deficiency anemia.  PLAN:    Iron  deficiency anemia: Resolved.  Patient's hemoglobin iron  stores continue to be within normal limits.  Colonoscopy and EGD in October 2024 did not reveal any significant pathology.  She does not require additional IV Venofer  at this time.  She last received treatment on January 06, 2024.  No intervention is needed at this time.  No  further follow-up has been scheduled. Nausea: Unclear etiology.  Patient was given a prescription for Compazine and instructed to follow-up with her GI physician. Weakness and fatigue: Unrelated to history of iron  deficiency.  Follow-up with GI and primary care as above.   Patient expressed understanding and was in agreement with this plan. She also understands that She can call clinic at any time with any questions, concerns, or complaints.    Evalene JINNY Reusing, MD   12/08/2024 1:06 PM

## 2024-12-08 NOTE — Progress Notes (Signed)
 Over the last month she has been nauseated, losing weight, no appetite which is due to the nauseated sensation. She feels like it may be due to her iron  levels.

## 2024-12-26 ENCOUNTER — Other Ambulatory Visit

## 2024-12-27 ENCOUNTER — Ambulatory Visit

## 2024-12-27 ENCOUNTER — Ambulatory Visit: Admitting: Oncology
